# Patient Record
Sex: Female | Born: 1970
Health system: Southern US, Community
[De-identification: ages and names within clinical notes are randomized; demographics above are authoritative.]

## PROBLEM LIST (undated history)

## (undated) DIAGNOSIS — F329 Major depressive disorder, single episode, unspecified: Secondary | ICD-10-CM

## (undated) DIAGNOSIS — M1990 Unspecified osteoarthritis, unspecified site: Secondary | ICD-10-CM

## (undated) DIAGNOSIS — J45909 Unspecified asthma, uncomplicated: Secondary | ICD-10-CM

## (undated) DIAGNOSIS — E785 Hyperlipidemia, unspecified: Secondary | ICD-10-CM

## (undated) DIAGNOSIS — F32A Depression, unspecified: Secondary | ICD-10-CM

## (undated) DIAGNOSIS — M549 Dorsalgia, unspecified: Secondary | ICD-10-CM

## (undated) DIAGNOSIS — M5030 Other cervical disc degeneration, unspecified cervical region: Secondary | ICD-10-CM

## (undated) DIAGNOSIS — G4700 Insomnia, unspecified: Secondary | ICD-10-CM

## (undated) DIAGNOSIS — F988 Other specified behavioral and emotional disorders with onset usually occurring in childhood and adolescence: Secondary | ICD-10-CM

## (undated) DIAGNOSIS — F419 Anxiety disorder, unspecified: Secondary | ICD-10-CM

## (undated) HISTORY — DX: Unspecified asthma, uncomplicated: J45.909

## (undated) HISTORY — DX: Dorsalgia, unspecified: M54.9

## (undated) HISTORY — DX: Hyperlipidemia, unspecified: E78.5

## (undated) HISTORY — DX: Insomnia, unspecified: G47.00

## (undated) HISTORY — DX: Unspecified osteoarthritis, unspecified site: M19.90

## (undated) HISTORY — DX: Other cervical disc degeneration, unspecified cervical region: M50.30

## (undated) HISTORY — DX: Other specified behavioral and emotional disorders with onset usually occurring in childhood and adolescence: F98.8

---

## 2011-03-10 HISTORY — PX: DENTAL SURGERY: SHX609

## 2011-06-08 HISTORY — PX: REPAIR / REINSERT BICEPS TENDON AT ELBOW: SUR1148

## 2015-06-10 DIAGNOSIS — N390 Urinary tract infection, site not specified: Secondary | ICD-10-CM | POA: Diagnosis not present

## 2015-06-20 DIAGNOSIS — F3162 Bipolar disorder, current episode mixed, moderate: Secondary | ICD-10-CM | POA: Diagnosis not present

## 2015-06-21 DIAGNOSIS — E65 Localized adiposity: Secondary | ICD-10-CM

## 2015-06-21 DIAGNOSIS — N62 Hypertrophy of breast: Secondary | ICD-10-CM

## 2015-06-21 HISTORY — DX: Hypertrophy of breast: N62

## 2015-06-21 HISTORY — DX: Localized adiposity: E65

## 2015-07-18 DIAGNOSIS — F3162 Bipolar disorder, current episode mixed, moderate: Secondary | ICD-10-CM | POA: Diagnosis not present

## 2015-07-29 DIAGNOSIS — M542 Cervicalgia: Secondary | ICD-10-CM | POA: Diagnosis not present

## 2015-07-29 DIAGNOSIS — E669 Obesity, unspecified: Secondary | ICD-10-CM | POA: Diagnosis not present

## 2015-07-29 DIAGNOSIS — M549 Dorsalgia, unspecified: Secondary | ICD-10-CM | POA: Diagnosis not present

## 2015-09-06 DIAGNOSIS — M542 Cervicalgia: Secondary | ICD-10-CM | POA: Diagnosis not present

## 2015-09-06 DIAGNOSIS — M546 Pain in thoracic spine: Secondary | ICD-10-CM | POA: Diagnosis not present

## 2015-09-06 DIAGNOSIS — M47812 Spondylosis without myelopathy or radiculopathy, cervical region: Secondary | ICD-10-CM | POA: Diagnosis not present

## 2015-09-16 DIAGNOSIS — N6489 Other specified disorders of breast: Secondary | ICD-10-CM | POA: Diagnosis not present

## 2015-09-16 DIAGNOSIS — E669 Obesity, unspecified: Secondary | ICD-10-CM | POA: Diagnosis not present

## 2015-09-16 DIAGNOSIS — E785 Hyperlipidemia, unspecified: Secondary | ICD-10-CM | POA: Diagnosis not present

## 2015-09-16 DIAGNOSIS — Z79899 Other long term (current) drug therapy: Secondary | ICD-10-CM | POA: Diagnosis not present

## 2015-09-16 DIAGNOSIS — M549 Dorsalgia, unspecified: Secondary | ICD-10-CM | POA: Diagnosis not present

## 2015-10-30 DIAGNOSIS — F3162 Bipolar disorder, current episode mixed, moderate: Secondary | ICD-10-CM | POA: Diagnosis not present

## 2015-12-09 DIAGNOSIS — L03211 Cellulitis of face: Secondary | ICD-10-CM | POA: Diagnosis not present

## 2015-12-09 DIAGNOSIS — R51 Headache: Secondary | ICD-10-CM | POA: Diagnosis not present

## 2015-12-09 DIAGNOSIS — H1045 Other chronic allergic conjunctivitis: Secondary | ICD-10-CM | POA: Diagnosis not present

## 2015-12-16 DIAGNOSIS — M542 Cervicalgia: Secondary | ICD-10-CM | POA: Diagnosis not present

## 2015-12-16 DIAGNOSIS — M549 Dorsalgia, unspecified: Secondary | ICD-10-CM | POA: Diagnosis not present

## 2015-12-16 DIAGNOSIS — E669 Obesity, unspecified: Secondary | ICD-10-CM | POA: Diagnosis not present

## 2015-12-16 DIAGNOSIS — E785 Hyperlipidemia, unspecified: Secondary | ICD-10-CM | POA: Diagnosis not present

## 2016-01-20 DIAGNOSIS — F3162 Bipolar disorder, current episode mixed, moderate: Secondary | ICD-10-CM | POA: Diagnosis not present

## 2016-03-17 DIAGNOSIS — E785 Hyperlipidemia, unspecified: Secondary | ICD-10-CM | POA: Diagnosis not present

## 2016-03-17 DIAGNOSIS — M549 Dorsalgia, unspecified: Secondary | ICD-10-CM | POA: Diagnosis not present

## 2016-03-17 DIAGNOSIS — Z6833 Body mass index (BMI) 33.0-33.9, adult: Secondary | ICD-10-CM | POA: Diagnosis not present

## 2016-03-19 DIAGNOSIS — E785 Hyperlipidemia, unspecified: Secondary | ICD-10-CM | POA: Diagnosis not present

## 2016-03-19 DIAGNOSIS — Z79899 Other long term (current) drug therapy: Secondary | ICD-10-CM | POA: Diagnosis not present

## 2016-04-10 ENCOUNTER — Encounter (HOSPITAL_BASED_OUTPATIENT_CLINIC_OR_DEPARTMENT_OTHER)
Admission: RE | Disposition: A | Discharge status: Home / Self Care | Payer: Self-pay | Source: Ambulatory Visit | Attending: Orthopedic Surgery

## 2016-04-10 ENCOUNTER — Ambulatory Visit (HOSPITAL_BASED_OUTPATIENT_CLINIC_OR_DEPARTMENT_OTHER)
Admission: RE | Admit: 2016-04-10 | Discharge: 2016-04-10 | Disposition: A | Discharge status: Home / Self Care | Payer: BLUE CROSS/BLUE SHIELD | Source: Ambulatory Visit | Attending: Orthopedic Surgery | Admitting: Orthopedic Surgery

## 2016-04-10 ENCOUNTER — Other Ambulatory Visit: Payer: Self-pay | Admitting: Orthopedic Surgery

## 2016-04-10 ENCOUNTER — Encounter (HOSPITAL_BASED_OUTPATIENT_CLINIC_OR_DEPARTMENT_OTHER): Payer: Self-pay

## 2016-04-10 ENCOUNTER — Ambulatory Visit (HOSPITAL_BASED_OUTPATIENT_CLINIC_OR_DEPARTMENT_OTHER): Payer: BLUE CROSS/BLUE SHIELD | Admitting: Anesthesiology

## 2016-04-10 DIAGNOSIS — M6510 Other infective (teno)synovitis, unspecified site: Secondary | ICD-10-CM | POA: Diagnosis not present

## 2016-04-10 DIAGNOSIS — L03019 Cellulitis of unspecified finger: Secondary | ICD-10-CM | POA: Diagnosis not present

## 2016-04-10 DIAGNOSIS — S61259A Open bite of unspecified finger without damage to nail, initial encounter: Secondary | ICD-10-CM | POA: Diagnosis not present

## 2016-04-10 DIAGNOSIS — M65141 Other infective (teno)synovitis, right hand: Secondary | ICD-10-CM | POA: Diagnosis not present

## 2016-04-10 DIAGNOSIS — F419 Anxiety disorder, unspecified: Secondary | ICD-10-CM | POA: Insufficient documentation

## 2016-04-10 DIAGNOSIS — Z79899 Other long term (current) drug therapy: Secondary | ICD-10-CM | POA: Diagnosis not present

## 2016-04-10 DIAGNOSIS — W5501XA Bitten by cat, initial encounter: Secondary | ICD-10-CM | POA: Insufficient documentation

## 2016-04-10 DIAGNOSIS — S61252A Open bite of right middle finger without damage to nail, initial encounter: Secondary | ICD-10-CM | POA: Diagnosis not present

## 2016-04-10 DIAGNOSIS — L089 Local infection of the skin and subcutaneous tissue, unspecified: Secondary | ICD-10-CM

## 2016-04-10 DIAGNOSIS — S61451A Open bite of right hand, initial encounter: Secondary | ICD-10-CM

## 2016-04-10 DIAGNOSIS — A280 Pasteurellosis: Secondary | ICD-10-CM | POA: Diagnosis not present

## 2016-04-10 DIAGNOSIS — L02519 Cutaneous abscess of unspecified hand: Secondary | ICD-10-CM | POA: Diagnosis not present

## 2016-04-10 HISTORY — PX: I & D EXTREMITY: SHX5045

## 2016-04-10 HISTORY — DX: Depression, unspecified: F32.A

## 2016-04-10 HISTORY — DX: Other infective (teno)synovitis, unspecified site: M65.10

## 2016-04-10 HISTORY — DX: Anxiety disorder, unspecified: F41.9

## 2016-04-10 HISTORY — DX: Major depressive disorder, single episode, unspecified: F32.9

## 2016-04-10 HISTORY — DX: Open bite of right hand, initial encounter: S61.451A

## 2016-04-10 HISTORY — DX: Local infection of the skin and subcutaneous tissue, unspecified: L08.9

## 2016-04-10 SURGERY — IRRIGATION AND DEBRIDEMENT EXTREMITY
Anesthesia: General | Site: Hand | Laterality: Right

## 2016-04-10 MED ORDER — FENTANYL CITRATE (PF) 100 MCG/2ML IJ SOLN
INTRAMUSCULAR | Status: AC
  Filled 2016-04-10: qty 2

## 2016-04-10 MED ORDER — LACTATED RINGERS IV SOLN
INTRAVENOUS | Status: DC
  Administered 2016-04-10: 14:00:00 via INTRAVENOUS

## 2016-04-10 MED ORDER — OXYCODONE HCL 5 MG PO TABS: 5.0000 mg | ORAL_TABLET | Freq: Once | ORAL | Status: DC | PRN

## 2016-04-10 MED ORDER — LIDOCAINE 2% (20 MG/ML) 5 ML SYRINGE
INTRAMUSCULAR | Status: DC | PRN
  Administered 2016-04-10: 40 mg via INTRAVENOUS

## 2016-04-10 MED ORDER — ONDANSETRON HCL 4 MG/2ML IJ SOLN
INTRAMUSCULAR | Status: AC
  Filled 2016-04-10: qty 2

## 2016-04-10 MED ORDER — LIDOCAINE 2% (20 MG/ML) 5 ML SYRINGE
INTRAMUSCULAR | Status: AC
  Filled 2016-04-10: qty 5

## 2016-04-10 MED ORDER — VANCOMYCIN HCL IN DEXTROSE 1-5 GM/200ML-% IV SOLN
INTRAVENOUS | Status: AC
  Filled 2016-04-10: qty 200

## 2016-04-10 MED ORDER — MIDAZOLAM HCL 2 MG/2ML IJ SOLN
INTRAMUSCULAR | Status: AC
  Filled 2016-04-10: qty 2

## 2016-04-10 MED ORDER — PROPOFOL 10 MG/ML IV BOLUS
INTRAVENOUS | Status: DC | PRN
  Administered 2016-04-10: 20 mg via INTRAVENOUS
  Administered 2016-04-10: 180 mg via INTRAVENOUS

## 2016-04-10 MED ORDER — HYDROMORPHONE HCL 1 MG/ML IJ SOLN
0.2500 mg | INTRAMUSCULAR | Status: DC | PRN
  Administered 2016-04-10 (×2): 0.5 mg via INTRAVENOUS

## 2016-04-10 MED ORDER — ONDANSETRON HCL 4 MG/2ML IJ SOLN
INTRAMUSCULAR | Status: DC | PRN
Start: 2016-04-10 — End: 2016-04-10
  Administered 2016-04-10: 4 mg via INTRAVENOUS

## 2016-04-10 MED ORDER — MEPERIDINE HCL 25 MG/ML IJ SOLN: 6.2500 mg | INTRAMUSCULAR | Status: DC | PRN

## 2016-04-10 MED ORDER — FENTANYL CITRATE (PF) 100 MCG/2ML IJ SOLN
50.0000 ug | INTRAMUSCULAR | Status: DC | PRN
  Administered 2016-04-10 (×2): 100 ug via INTRAVENOUS

## 2016-04-10 MED ORDER — DEXAMETHASONE SODIUM PHOSPHATE 10 MG/ML IJ SOLN
INTRAMUSCULAR | Status: AC
  Filled 2016-04-10: qty 1

## 2016-04-10 MED ORDER — OXYCODONE HCL 5 MG/5ML PO SOLN: 5.0000 mg | Freq: Once | ORAL | Status: DC | PRN

## 2016-04-10 MED ORDER — KETOROLAC TROMETHAMINE 30 MG/ML IJ SOLN
INTRAMUSCULAR | Status: AC
  Filled 2016-04-10: qty 1

## 2016-04-10 MED ORDER — VANCOMYCIN HCL 1000 MG IV SOLR
INTRAVENOUS | Status: DC | PRN
  Administered 2016-04-10: 1000 mg via INTRAVENOUS

## 2016-04-10 MED ORDER — SCOPOLAMINE 1 MG/3DAYS TD PT72: 1.0000 | MEDICATED_PATCH | Freq: Once | TRANSDERMAL | Status: DC | PRN

## 2016-04-10 MED ORDER — HYDROMORPHONE HCL 1 MG/ML IJ SOLN
INTRAMUSCULAR | Status: AC
  Filled 2016-04-10: qty 1

## 2016-04-10 MED ORDER — PROMETHAZINE HCL 25 MG/ML IJ SOLN: 6.2500 mg | INTRAMUSCULAR | Status: DC | PRN

## 2016-04-10 MED ORDER — MIDAZOLAM HCL 2 MG/2ML IJ SOLN
1.0000 mg | INTRAMUSCULAR | Status: DC | PRN
  Administered 2016-04-10: 2 mg via INTRAVENOUS

## 2016-04-10 MED ORDER — DEXAMETHASONE SODIUM PHOSPHATE 4 MG/ML IJ SOLN
INTRAMUSCULAR | Status: DC | PRN
  Administered 2016-04-10: 10 mg via INTRAVENOUS

## 2016-04-10 MED ORDER — BUPIVACAINE HCL (PF) 0.25 % IJ SOLN
INTRAMUSCULAR | Status: DC | PRN
Start: 2016-04-10 — End: 2016-04-10
  Administered 2016-04-10: 9 mL

## 2016-04-10 SURGICAL SUPPLY — 43 items
BAG DECANTER FOR FLEXI CONT (MISCELLANEOUS) IMPLANT
BLADE MINI RND TIP GREEN BEAV (BLADE) IMPLANT
BLADE SURG 15 STRL LF DISP TIS (BLADE) ×1 IMPLANT
BLADE SURG 15 STRL SS (BLADE) ×1
BNDG COHESIVE 1X5 TAN STRL LF (GAUZE/BANDAGES/DRESSINGS) IMPLANT
BNDG COHESIVE 3X5 TAN STRL LF (GAUZE/BANDAGES/DRESSINGS) IMPLANT
BNDG ESMARK 4X9 LF (GAUZE/BANDAGES/DRESSINGS) IMPLANT
BNDG GAUZE ELAST 4 BULKY (GAUZE/BANDAGES/DRESSINGS) IMPLANT
CHLORAPREP W/TINT 26ML (MISCELLANEOUS) ×2 IMPLANT
CORDS BIPOLAR (ELECTRODE) ×2 IMPLANT
COVER BACK TABLE 60X90IN (DRAPES) ×2 IMPLANT
COVER MAYO STAND STRL (DRAPES) ×2 IMPLANT
CUFF TOURNIQUET SINGLE 18IN (TOURNIQUET CUFF) IMPLANT
DRAPE EXTREMITY T 121X128X90 (DRAPE) ×2 IMPLANT
DRAPE SURG 17X23 STRL (DRAPES) ×2 IMPLANT
GAUZE PACKING IODOFORM 1/4X15 (GAUZE/BANDAGES/DRESSINGS) IMPLANT
GAUZE SPONGE 4X4 12PLY STRL (GAUZE/BANDAGES/DRESSINGS) ×2 IMPLANT
GAUZE XEROFORM 1X8 LF (GAUZE/BANDAGES/DRESSINGS) ×2 IMPLANT
GLOVE BIOGEL PI IND STRL 8.5 (GLOVE) ×1 IMPLANT
GLOVE BIOGEL PI INDICATOR 8.5 (GLOVE) ×1
GLOVE SURG ORTHO 8.0 STRL STRW (GLOVE) ×2 IMPLANT
GOWN STRL REUS W/ TWL LRG LVL3 (GOWN DISPOSABLE) ×1 IMPLANT
GOWN STRL REUS W/TWL LRG LVL3 (GOWN DISPOSABLE) ×1
GOWN STRL REUS W/TWL XL LVL3 (GOWN DISPOSABLE) ×2 IMPLANT
LOOP VESSEL MAXI BLUE (MISCELLANEOUS) IMPLANT
NEEDLE PRECISIONGLIDE 27X1.5 (NEEDLE) IMPLANT
NS IRRIG 1000ML POUR BTL (IV SOLUTION) ×2 IMPLANT
PACK BASIN DAY SURGERY FS (CUSTOM PROCEDURE TRAY) ×2 IMPLANT
PAD CAST 3X4 CTTN HI CHSV (CAST SUPPLIES) IMPLANT
PADDING CAST ABS 4INX4YD NS (CAST SUPPLIES) ×1
PADDING CAST ABS COTTON 4X4 ST (CAST SUPPLIES) ×1 IMPLANT
PADDING CAST COTTON 3X4 STRL (CAST SUPPLIES)
SPLINT PLASTER CAST XFAST 3X15 (CAST SUPPLIES) IMPLANT
SPLINT PLASTER XTRA FASTSET 3X (CAST SUPPLIES)
STOCKINETTE 4X48 STRL (DRAPES) ×2 IMPLANT
SUT ETHILON 4 0 PS 2 18 (SUTURE) ×2 IMPLANT
SWAB COLLECTION DEVICE MRSA (MISCELLANEOUS) IMPLANT
SWAB CULTURE ESWAB REG 1ML (MISCELLANEOUS) IMPLANT
SYR BULB 3OZ (MISCELLANEOUS) ×2 IMPLANT
SYR CONTROL 10ML LL (SYRINGE) IMPLANT
TOWEL OR 17X24 6PK STRL BLUE (TOWEL DISPOSABLE) ×4 IMPLANT
TUBE FEEDING ENTERAL 5FR 16IN (TUBING) IMPLANT
UNDERPAD 30X30 (UNDERPADS AND DIAPERS) ×2 IMPLANT

## 2016-04-10 NOTE — Transfer of Care (Signed)
Immediate Anesthesia Transfer of Care Note  Patient: Chloe Harris  Procedure(s) Performed: Procedure(s) with comments: IRRIGATION AND DEBRIDEMENT EXTREMITY (Right) - I&D FLEXOR SHEATH RIGHT MIDDLE FINGER  Patient Location: PACU  Anesthesia Type:General  Level of Consciousness: awake and sedated  Airway & Oxygen Therapy: Patient Spontanous Breathing and Patient connected to face mask oxygen  Post-op Assessment: Report given to RN and Post -op Vital signs reviewed and stable  Post vital signs: Reviewed and stable  Last Vitals:  Vitals:   04/10/16 1347  BP: (!) 104/50  Pulse: 80  Resp: 18  Temp: 37.3 C    Last Pain:  Vitals:   04/10/16 1347  TempSrc: Oral  PainSc: 6       Patients Stated Pain Goal: 2 ([id number])  Complications: No apparent anesthesia complications

## 2016-04-10 NOTE — Op Note (Signed)
Dictation Number [id number]

## 2016-04-10 NOTE — Brief Op Note (Signed)
04/10/2016  3:42 PM  PATIENT:  Chloe Harris  46 y.o. female  PRE-OPERATIVE DIAGNOSIS:  cat bite  POST-OPERATIVE DIAGNOSIS:  cat bite  PROCEDURE:  Procedure(s) with comments: IRRIGATION AND DEBRIDEMENT EXTREMITY (Right) - I&D FLEXOR SHEATH RIGHT MIDDLE FINGER  SURGEON:  Surgeon(s) and Role:    * Daryll Brod, MD - Primary  PHYSICIAN ASSISTANT:   ASSISTANTS: none   ANESTHESIA:   local and general  EBL:  Total I/O In: 600 [I.V.:600] Out: 2 [Blood:2]  BLOOD ADMINISTERED:none  DRAINS: Penrose drain in the finnger   LOCAL MEDICATIONS USED:  BUPIVICAINE   SPECIMEN:  Source of Specimen:  cultures  DISPOSITION OF SPECIMEN:  PATHOLOGY  COUNTS:  YES  TOURNIQUET:   Total Tourniquet Time Documented: Upper Arm (Right) - 23 minutes Total: Upper Arm (Right) - 23 minutes   DICTATION: .Other Dictation: Dictation Number [id number]  PLAN OF CARE: Discharge to home after PACU  PATIENT DISPOSITION:  PACU - hemodynamically stable.

## 2016-04-10 NOTE — Anesthesia Procedure Notes (Signed)
Procedure Name: LMA Insertion Performed by: Arianny Pun W Pre-anesthesia Checklist: Patient identified, Emergency Drugs available, Suction available and Patient being monitored Patient Re-evaluated:Patient Re-evaluated prior to inductionOxygen Delivery Method: Circle system utilized Preoxygenation: Pre-oxygenation with 100% oxygen Intubation Type: IV induction Ventilation: Mask ventilation without difficulty LMA: LMA inserted LMA Size: 4.0 Number of attempts: 1 Placement Confirmation: positive ETCO2 Tube secured with: Tape Dental Injury: Teeth and Oropharynx as per pre-operative assessment        

## 2016-04-10 NOTE — Anesthesia Preprocedure Evaluation (Addendum)
Anesthesia Evaluation  Patient identified by MRN, date of birth, ID band Patient awake    Reviewed: Allergy & Precautions, NPO status , Patient's Chart, lab work & pertinent test results  Airway Mallampati: II  TM Distance: >3 FB Neck ROM: Full    Dental no notable dental hx.    Pulmonary neg pulmonary ROS,    Pulmonary exam normal breath sounds clear to auscultation       Cardiovascular negative cardio ROS Normal cardiovascular exam Rhythm:Regular Rate:Normal     Neuro/Psych negative neurological ROS  negative psych ROS   GI/Hepatic negative GI ROS, Neg liver ROS,   Endo/Other  negative endocrine ROS  Renal/GU negative Renal ROS     Musculoskeletal negative musculoskeletal ROS (+)   Abdominal   Peds  Hematology negative hematology ROS (+)   Anesthesia Other Findings   Reproductive/Obstetrics negative OB ROS                             Anesthesia Physical Anesthesia Plan  ASA: II  Anesthesia Plan: General   Post-op Pain Management:    Induction: Intravenous  Airway Management Planned: LMA  Additional Equipment:   Intra-op Plan:   Post-operative Plan: Extubation in OR  Informed Consent: I have reviewed the patients History and Physical, chart, labs and discussed the procedure including the risks, benefits and alternatives for the proposed anesthesia with the patient or authorized representative who has indicated his/her understanding and acceptance.   Dental advisory given  Plan Discussed with: CRNA  Anesthesia Plan Comments:        Anesthesia Quick Evaluation

## 2016-04-10 NOTE — Discharge Instructions (Addendum)

## 2016-04-10 NOTE — H&P (Signed)
.   Chloe Harris is an 46 y.o. female.   Chief Complaint: cat bite right middle finger HPI: Chloe Harris is a 32 right handed female who was bit by her cat yesterday. She has has increasing pain and swelling of the middle finger. She has been on doxycycline.    Past Medical History:  Diagnosis Date  . Anxiety   . Depression     Past Surgical History:  Procedure Laterality Date  . CESAREAN SECTION  2004  . DENTAL SURGERY  2013   Bone graft  . REPAIR / REINSERT BICEPS TENDON AT ELBOW Right 06/2011    History reviewed. No pertinent family history. Social History:  reports that she has never smoked. She has never used smokeless tobacco. She reports that she drinks alcohol. She reports that she uses drugs about 2 times per week.  Allergies:  Allergies  Allergen Reactions  . Biaxin [Clarithromycin] Swelling  . Penicillins Hives  . Ciprofloxacin Nausea And Vomiting  . Sulfa Antibiotics Hives and Nausea Only    Medications Prior to Admission  Medication Sig Dispense Refill  . atorvastatin (LIPITOR) 20 MG tablet Take 20 mg by mouth daily.    . clonazePAM (KLONOPIN) 1 MG tablet Take 1 mg by mouth 2 (two) times daily as needed for anxiety (as needed).    . eszopiclone (LUNESTA) 2 MG TABS tablet Take 3 mg by mouth at bedtime as needed for sleep. Take immediately before bedtime    . lamoTRIgine (LAMICTAL) 200 MG tablet Take 200 mg by mouth daily.    . Lorcaserin HCl (BELVIQ) 10 MG TABS Take 10 mg by mouth 1 day or 1 dose.      No results found for this or any previous visit (from the past 48 hour(s)).  No results found.   Pertinent items are noted in HPI.  Blood pressure (!) 104/50, pulse 80, temperature 99.2 F (37.3 C), temperature source Oral, resp. rate 18, height 4\' 11"  (1.499 m), weight 75.8 kg (167 lb), last menstrual period 03/25/2016, SpO2 98 %.  General appearance: alert, cooperative and appears stated age Head: Normocephalic, without obvious abnormality Neck: no  JVD Resp: clear to auscultation bilaterally Cardio: regular rate and rhythm, S1, S2 normal, no murmur, click, rub or gallop GI: soft, non-tender; bowel sounds normal; no masses,  no organomegaly Extremities: Kanavels signs right middle finger Pulses: 2+ and symmetric Skin: Skin color, texture, turgor normal. No rashes or lesions Neurologic: Grossly normal Incision/Wound: open  Assessment/Plan Flexor sheath infection right middle finger Plan: I&D right middle finger  Leshaun Biebel R 04/10/2016, 2:44 PM

## 2016-04-10 NOTE — Anesthesia Postprocedure Evaluation (Signed)
Anesthesia Post Note  Patient: Chloe Harris  Procedure(s) Performed: Procedure(s) (LRB): IRRIGATION AND DEBRIDEMENT EXTREMITY (Right)  Patient location during evaluation: PACU Anesthesia Type: General Level of consciousness: sedated and patient cooperative Pain management: pain level controlled Vital Signs Assessment: post-procedure vital signs reviewed and stable Respiratory status: spontaneous breathing Cardiovascular status: stable Anesthetic complications: no       Last Vitals:  Vitals:   04/10/16 1630 04/10/16 1637  BP: 120/72   Pulse: 79 82  Resp: 12 15  Temp:      Last Pain:  Vitals:   04/10/16 1615  TempSrc:   PainSc: [place]

## 2016-04-11 NOTE — Op Note (Signed)
NAMEJETAIME, Chloe Harris NO.:  [id number]  MEDICAL RECORD NO.:  [id number]  LOCATION:                                 FACILITY:  PHYSICIAN:  Daryll Brod, M.D.            DATE OF BIRTH:  DATE OF PROCEDURE:  04/10/2016 DATE OF DISCHARGE:                              OPERATIVE REPORT   PREOPERATIVE DIAGNOSIS:  Flexor sheath infection, right middle finger.  POSTOPERATIVE DIAGNOSIS:  Flexor sheath infection, right middle finger.  OPERATION:  Incision and drainage of cat bite, with incision and drainage of flexor sheath with irrigation, right middle finger.  SURGEON:  Daryll Brod, MD.  ANESTHESIA:  General.  PLACE OF SURGERY:  [facility].  HISTORY:  The patient is a 46 year old female, who sustained a cat bite to her right middle finger yesterday on April 09, 2016.  She received doxycycline but this had significantly swollen, become progressively more painful, red with extending pain proximally and distally into the palm and the digit.  She is seen and admitted for incision and drainage with cannibal signs being present to the right middle finger.  She is admitted for incision and drainage.  She is aware that there is potential for significant problems with stiffness; injury to arteries, nerves, tendons; loss of tendon function to the middle finger requiring further intervention.  In the preoperative area, the patient was seen, the extremity marked by both the patient and surgeon.  PROCEDURE IN DETAIL:  The patient was brought to the operating room, where a general anesthetic was carried out without difficulty.  She was prepped using Betadine scrub and solution in a supine position with the right arm free.  A 3-minute dry time was allowed.  Time-out taken confirming the patient and procedure.  The areas of the 2 bite wounds on the volar aspect of the middle and proximal phalanx were then each opened making an incision.  Purulent material was  immediately encountered, and cultures taken for both aerobic and anaerobic cultures. A separate incision was then made over the area just proximal to the distal interphalangeal joint crease.  This was carried down through subcutaneous tissues centrally located to the flexor tendon.  A separate incision was then made at the flexion crease of the middle finger in the palm, this was oblique in nature.  This was carried down through subcutaneous tissue to the A1 pulley.  Retractors were placed protecting the neurovascular bundles radially and ulnarly.  A small incision was made in the central aspect of the A1 pulley.  A partial tenosynovectomy performed proximally.  An irrigation catheter, infant feeding tube was then attempted to be inserted in a distal proximal direction without significant success.  This was then reversed and placed in the A1 pulley and threaded off along the flexor sheath.  This was then irrigated with saline irrigating it with approximately 350-400 mL of saline.  Good flow was immediately encountered distally.  A 22-gauge intracath needle, had the needle removed and the sheath was then passed into the flexor tendon sheath in the distal and irrigation from a distal to proximal direction was then performed.  A total of  approximately 500 mL of saline was used. This was entirely clear.  Significant fluid was present in the flexor sheath when it was initially opened proximally.  The wounds were then packed with iodoform gauze.  The infant feeding tube was left into the sheath proximally to maintain adequate drainage.  A local metacarpal block with 0.25% bupivacaine without epinephrine was given, approximately 9 mL was used.  A sterile compressive dressing, dorsal splint was applied after application of iodoform gauze in each of the wounds to maintain them open proximally, distally on either side of the original bite.  On deflation of the tourniquet, all fingers  immediately pinked.  Tourniquet was inflated at the beginning of the procedure with exsanguination of the limb from the wrist proximally.  All fingers immediately pinked.  She was taken to the recovery room for observation in satisfactory condition.  She will be discharged to home to return to the [facility] on Monday.  She is discharged on Norco, Flagyl, and doxycycline.          ______________________________ Daryll Brod, M.D.     GK/MEDQ  D:  04/10/2016  T:  04/11/2016  Job:  [id number]

## 2016-04-13 ENCOUNTER — Encounter (HOSPITAL_BASED_OUTPATIENT_CLINIC_OR_DEPARTMENT_OTHER): Payer: Self-pay | Admitting: Orthopedic Surgery

## 2016-04-13 DIAGNOSIS — M79644 Pain in right finger(s): Secondary | ICD-10-CM

## 2016-04-13 DIAGNOSIS — S61451A Open bite of right hand, initial encounter: Secondary | ICD-10-CM | POA: Diagnosis not present

## 2016-04-13 DIAGNOSIS — S61451D Open bite of right hand, subsequent encounter: Secondary | ICD-10-CM | POA: Diagnosis not present

## 2016-04-13 DIAGNOSIS — S61259D Open bite of unspecified finger without damage to nail, subsequent encounter: Secondary | ICD-10-CM | POA: Diagnosis not present

## 2016-04-13 HISTORY — DX: Pain in right finger(s): M79.644

## 2016-04-14 ENCOUNTER — Encounter (HOSPITAL_BASED_OUTPATIENT_CLINIC_OR_DEPARTMENT_OTHER): Payer: Self-pay | Admitting: Orthopedic Surgery

## 2016-04-14 NOTE — Addendum Note (Signed)
Addendum  created 04/14/16 0741 by Nolon Nations, MD   Sign clinical note, SmartForm saved

## 2016-04-15 DIAGNOSIS — S61259A Open bite of unspecified finger without damage to nail, initial encounter: Secondary | ICD-10-CM | POA: Diagnosis not present

## 2016-04-15 DIAGNOSIS — M79644 Pain in right finger(s): Secondary | ICD-10-CM | POA: Diagnosis not present

## 2016-04-15 DIAGNOSIS — S61451A Open bite of right hand, initial encounter: Secondary | ICD-10-CM | POA: Diagnosis not present

## 2016-04-15 DIAGNOSIS — S61451D Open bite of right hand, subsequent encounter: Secondary | ICD-10-CM | POA: Diagnosis not present

## 2016-04-15 LAB — AEROBIC/ANAEROBIC CULTURE (SURGICAL/DEEP WOUND)

## 2016-04-15 LAB — AEROBIC/ANAEROBIC CULTURE W GRAM STAIN (SURGICAL/DEEP WOUND)

## 2016-04-17 DIAGNOSIS — S61451A Open bite of right hand, initial encounter: Secondary | ICD-10-CM | POA: Diagnosis not present

## 2016-04-17 DIAGNOSIS — S61259D Open bite of unspecified finger without damage to nail, subsequent encounter: Secondary | ICD-10-CM | POA: Diagnosis not present

## 2016-04-17 DIAGNOSIS — S61451D Open bite of right hand, subsequent encounter: Secondary | ICD-10-CM | POA: Diagnosis not present

## 2016-04-17 DIAGNOSIS — M79644 Pain in right finger(s): Secondary | ICD-10-CM | POA: Diagnosis not present

## 2016-04-20 DIAGNOSIS — M6510 Other infective (teno)synovitis, unspecified site: Secondary | ICD-10-CM | POA: Diagnosis not present

## 2016-04-20 DIAGNOSIS — S61451A Open bite of right hand, initial encounter: Secondary | ICD-10-CM | POA: Diagnosis not present

## 2016-04-20 DIAGNOSIS — M25641 Stiffness of right hand, not elsewhere classified: Secondary | ICD-10-CM

## 2016-04-20 DIAGNOSIS — S61451D Open bite of right hand, subsequent encounter: Secondary | ICD-10-CM | POA: Diagnosis not present

## 2016-04-20 DIAGNOSIS — M79644 Pain in right finger(s): Secondary | ICD-10-CM | POA: Diagnosis not present

## 2016-04-20 HISTORY — DX: Stiffness of right hand, not elsewhere classified: M25.641

## 2016-05-11 DIAGNOSIS — H1013 Acute atopic conjunctivitis, bilateral: Secondary | ICD-10-CM | POA: Diagnosis not present

## 2016-05-25 DIAGNOSIS — F3162 Bipolar disorder, current episode mixed, moderate: Secondary | ICD-10-CM | POA: Diagnosis not present

## 2016-08-06 DIAGNOSIS — R41840 Attention and concentration deficit: Secondary | ICD-10-CM | POA: Diagnosis not present

## 2016-08-06 DIAGNOSIS — F902 Attention-deficit hyperactivity disorder, combined type: Secondary | ICD-10-CM | POA: Diagnosis not present

## 2016-08-06 DIAGNOSIS — F419 Anxiety disorder, unspecified: Secondary | ICD-10-CM | POA: Diagnosis not present

## 2016-08-06 DIAGNOSIS — Z79899 Other long term (current) drug therapy: Secondary | ICD-10-CM | POA: Diagnosis not present

## 2016-08-06 DIAGNOSIS — F1920 Other psychoactive substance dependence, uncomplicated: Secondary | ICD-10-CM | POA: Diagnosis not present

## 2016-09-08 DIAGNOSIS — Z79899 Other long term (current) drug therapy: Secondary | ICD-10-CM | POA: Diagnosis not present

## 2016-09-08 DIAGNOSIS — N912 Amenorrhea, unspecified: Secondary | ICD-10-CM | POA: Diagnosis not present

## 2016-09-08 DIAGNOSIS — Z6832 Body mass index (BMI) 32.0-32.9, adult: Secondary | ICD-10-CM | POA: Diagnosis not present

## 2016-09-08 DIAGNOSIS — E785 Hyperlipidemia, unspecified: Secondary | ICD-10-CM | POA: Diagnosis not present

## 2016-09-08 DIAGNOSIS — M549 Dorsalgia, unspecified: Secondary | ICD-10-CM | POA: Diagnosis not present

## 2016-10-14 DIAGNOSIS — F902 Attention-deficit hyperactivity disorder, combined type: Secondary | ICD-10-CM | POA: Diagnosis not present

## 2016-10-14 DIAGNOSIS — F1920 Other psychoactive substance dependence, uncomplicated: Secondary | ICD-10-CM | POA: Diagnosis not present

## 2016-10-14 DIAGNOSIS — F419 Anxiety disorder, unspecified: Secondary | ICD-10-CM | POA: Diagnosis not present

## 2016-10-14 DIAGNOSIS — Z79899 Other long term (current) drug therapy: Secondary | ICD-10-CM | POA: Diagnosis not present

## 2016-10-14 DIAGNOSIS — F338 Other recurrent depressive disorders: Secondary | ICD-10-CM | POA: Diagnosis not present

## 2016-10-21 DIAGNOSIS — R350 Frequency of micturition: Secondary | ICD-10-CM | POA: Diagnosis not present

## 2016-12-08 DIAGNOSIS — F3162 Bipolar disorder, current episode mixed, moderate: Secondary | ICD-10-CM | POA: Diagnosis not present

## 2016-12-09 DIAGNOSIS — F902 Attention-deficit hyperactivity disorder, combined type: Secondary | ICD-10-CM | POA: Diagnosis not present

## 2016-12-09 DIAGNOSIS — F338 Other recurrent depressive disorders: Secondary | ICD-10-CM | POA: Diagnosis not present

## 2016-12-09 DIAGNOSIS — F419 Anxiety disorder, unspecified: Secondary | ICD-10-CM | POA: Diagnosis not present

## 2016-12-09 DIAGNOSIS — Z79899 Other long term (current) drug therapy: Secondary | ICD-10-CM | POA: Diagnosis not present

## 2016-12-10 DIAGNOSIS — J45909 Unspecified asthma, uncomplicated: Secondary | ICD-10-CM | POA: Diagnosis not present

## 2016-12-10 DIAGNOSIS — E785 Hyperlipidemia, unspecified: Secondary | ICD-10-CM | POA: Diagnosis not present

## 2016-12-10 DIAGNOSIS — M549 Dorsalgia, unspecified: Secondary | ICD-10-CM | POA: Diagnosis not present

## 2016-12-10 DIAGNOSIS — Z6832 Body mass index (BMI) 32.0-32.9, adult: Secondary | ICD-10-CM | POA: Diagnosis not present

## 2016-12-25 DIAGNOSIS — L300 Nummular dermatitis: Secondary | ICD-10-CM | POA: Diagnosis not present

## 2016-12-25 DIAGNOSIS — B3783 Candidal cheilitis: Secondary | ICD-10-CM | POA: Diagnosis not present

## 2017-01-01 DIAGNOSIS — F338 Other recurrent depressive disorders: Secondary | ICD-10-CM | POA: Diagnosis not present

## 2017-01-01 DIAGNOSIS — F902 Attention-deficit hyperactivity disorder, combined type: Secondary | ICD-10-CM | POA: Diagnosis not present

## 2017-01-01 DIAGNOSIS — F419 Anxiety disorder, unspecified: Secondary | ICD-10-CM | POA: Diagnosis not present

## 2017-01-01 DIAGNOSIS — Z79899 Other long term (current) drug therapy: Secondary | ICD-10-CM | POA: Diagnosis not present

## 2017-01-14 DIAGNOSIS — F902 Attention-deficit hyperactivity disorder, combined type: Secondary | ICD-10-CM | POA: Diagnosis not present

## 2017-01-14 DIAGNOSIS — F3174 Bipolar disorder, in full remission, most recent episode manic: Secondary | ICD-10-CM | POA: Diagnosis not present

## 2017-01-14 DIAGNOSIS — Z79899 Other long term (current) drug therapy: Secondary | ICD-10-CM | POA: Diagnosis not present

## 2017-02-08 DIAGNOSIS — Z79899 Other long term (current) drug therapy: Secondary | ICD-10-CM | POA: Diagnosis not present

## 2017-02-08 DIAGNOSIS — F902 Attention-deficit hyperactivity disorder, combined type: Secondary | ICD-10-CM | POA: Diagnosis not present

## 2017-02-08 DIAGNOSIS — F419 Anxiety disorder, unspecified: Secondary | ICD-10-CM | POA: Diagnosis not present

## 2017-02-08 DIAGNOSIS — F3174 Bipolar disorder, in full remission, most recent episode manic: Secondary | ICD-10-CM | POA: Diagnosis not present

## 2017-02-09 DIAGNOSIS — F3162 Bipolar disorder, current episode mixed, moderate: Secondary | ICD-10-CM | POA: Diagnosis not present

## 2017-03-30 DIAGNOSIS — H9201 Otalgia, right ear: Secondary | ICD-10-CM | POA: Diagnosis not present

## 2017-03-30 DIAGNOSIS — F1990 Other psychoactive substance use, unspecified, uncomplicated: Secondary | ICD-10-CM | POA: Diagnosis not present

## 2017-05-11 DIAGNOSIS — Z79899 Other long term (current) drug therapy: Secondary | ICD-10-CM | POA: Diagnosis not present

## 2017-05-11 DIAGNOSIS — F419 Anxiety disorder, unspecified: Secondary | ICD-10-CM | POA: Diagnosis not present

## 2017-05-11 DIAGNOSIS — F3174 Bipolar disorder, in full remission, most recent episode manic: Secondary | ICD-10-CM | POA: Diagnosis not present

## 2017-05-11 DIAGNOSIS — F338 Other recurrent depressive disorders: Secondary | ICD-10-CM | POA: Diagnosis not present

## 2017-05-11 DIAGNOSIS — F902 Attention-deficit hyperactivity disorder, combined type: Secondary | ICD-10-CM | POA: Diagnosis not present

## 2017-05-12 DIAGNOSIS — F901 Attention-deficit hyperactivity disorder, predominantly hyperactive type: Secondary | ICD-10-CM | POA: Diagnosis not present

## 2017-05-12 DIAGNOSIS — R05 Cough: Secondary | ICD-10-CM | POA: Diagnosis not present

## 2017-05-12 DIAGNOSIS — E785 Hyperlipidemia, unspecified: Secondary | ICD-10-CM | POA: Diagnosis not present

## 2017-05-12 DIAGNOSIS — F3162 Bipolar disorder, current episode mixed, moderate: Secondary | ICD-10-CM | POA: Diagnosis not present

## 2017-05-12 DIAGNOSIS — J45909 Unspecified asthma, uncomplicated: Secondary | ICD-10-CM | POA: Diagnosis not present

## 2017-05-12 DIAGNOSIS — M549 Dorsalgia, unspecified: Secondary | ICD-10-CM | POA: Diagnosis not present

## 2017-07-14 DIAGNOSIS — F3174 Bipolar disorder, in full remission, most recent episode manic: Secondary | ICD-10-CM | POA: Diagnosis not present

## 2017-07-14 DIAGNOSIS — F900 Attention-deficit hyperactivity disorder, predominantly inattentive type: Secondary | ICD-10-CM | POA: Diagnosis not present

## 2017-07-30 DIAGNOSIS — F419 Anxiety disorder, unspecified: Secondary | ICD-10-CM | POA: Diagnosis not present

## 2017-07-30 DIAGNOSIS — Z79899 Other long term (current) drug therapy: Secondary | ICD-10-CM | POA: Diagnosis not present

## 2017-07-30 DIAGNOSIS — F338 Other recurrent depressive disorders: Secondary | ICD-10-CM | POA: Diagnosis not present

## 2017-07-30 DIAGNOSIS — F902 Attention-deficit hyperactivity disorder, combined type: Secondary | ICD-10-CM | POA: Diagnosis not present

## 2017-08-03 DIAGNOSIS — E785 Hyperlipidemia, unspecified: Secondary | ICD-10-CM | POA: Diagnosis not present

## 2017-08-03 DIAGNOSIS — Z79891 Long term (current) use of opiate analgesic: Secondary | ICD-10-CM | POA: Diagnosis not present

## 2017-08-03 DIAGNOSIS — M549 Dorsalgia, unspecified: Secondary | ICD-10-CM | POA: Diagnosis not present

## 2017-08-03 DIAGNOSIS — Z79899 Other long term (current) drug therapy: Secondary | ICD-10-CM | POA: Diagnosis not present

## 2017-08-03 DIAGNOSIS — N951 Menopausal and female climacteric states: Secondary | ICD-10-CM | POA: Diagnosis not present

## 2017-10-07 DIAGNOSIS — M549 Dorsalgia, unspecified: Secondary | ICD-10-CM | POA: Diagnosis not present

## 2017-10-29 DIAGNOSIS — Z79899 Other long term (current) drug therapy: Secondary | ICD-10-CM | POA: Diagnosis not present

## 2017-10-29 DIAGNOSIS — F419 Anxiety disorder, unspecified: Secondary | ICD-10-CM | POA: Diagnosis not present

## 2017-10-29 DIAGNOSIS — F902 Attention-deficit hyperactivity disorder, combined type: Secondary | ICD-10-CM | POA: Diagnosis not present

## 2017-12-30 DIAGNOSIS — F900 Attention-deficit hyperactivity disorder, predominantly inattentive type: Secondary | ICD-10-CM | POA: Diagnosis not present

## 2017-12-30 DIAGNOSIS — F3174 Bipolar disorder, in full remission, most recent episode manic: Secondary | ICD-10-CM | POA: Diagnosis not present

## 2018-01-21 DIAGNOSIS — M549 Dorsalgia, unspecified: Secondary | ICD-10-CM | POA: Diagnosis not present

## 2018-01-21 DIAGNOSIS — J45909 Unspecified asthma, uncomplicated: Secondary | ICD-10-CM | POA: Diagnosis not present

## 2018-01-21 DIAGNOSIS — R05 Cough: Secondary | ICD-10-CM | POA: Diagnosis not present

## 2018-01-21 DIAGNOSIS — E785 Hyperlipidemia, unspecified: Secondary | ICD-10-CM | POA: Diagnosis not present

## 2018-01-27 DIAGNOSIS — F419 Anxiety disorder, unspecified: Secondary | ICD-10-CM | POA: Diagnosis not present

## 2018-01-27 DIAGNOSIS — F902 Attention-deficit hyperactivity disorder, combined type: Secondary | ICD-10-CM | POA: Diagnosis not present

## 2018-01-27 DIAGNOSIS — F3174 Bipolar disorder, in full remission, most recent episode manic: Secondary | ICD-10-CM | POA: Diagnosis not present

## 2018-01-27 DIAGNOSIS — Z79899 Other long term (current) drug therapy: Secondary | ICD-10-CM | POA: Diagnosis not present

## 2018-03-23 DIAGNOSIS — F3174 Bipolar disorder, in full remission, most recent episode manic: Secondary | ICD-10-CM | POA: Diagnosis not present

## 2018-03-23 DIAGNOSIS — F900 Attention-deficit hyperactivity disorder, predominantly inattentive type: Secondary | ICD-10-CM | POA: Diagnosis not present

## 2018-05-03 DIAGNOSIS — Z79899 Other long term (current) drug therapy: Secondary | ICD-10-CM | POA: Diagnosis not present

## 2018-05-03 DIAGNOSIS — F419 Anxiety disorder, unspecified: Secondary | ICD-10-CM | POA: Diagnosis not present

## 2018-05-03 DIAGNOSIS — F902 Attention-deficit hyperactivity disorder, combined type: Secondary | ICD-10-CM | POA: Diagnosis not present

## 2018-06-13 DIAGNOSIS — M549 Dorsalgia, unspecified: Secondary | ICD-10-CM | POA: Diagnosis not present

## 2018-06-13 DIAGNOSIS — W57XXXA Bitten or stung by nonvenomous insect and other nonvenomous arthropods, initial encounter: Secondary | ICD-10-CM | POA: Diagnosis not present

## 2018-06-13 DIAGNOSIS — L03312 Cellulitis of back [any part except buttock]: Secondary | ICD-10-CM | POA: Diagnosis not present

## 2018-06-13 DIAGNOSIS — J45909 Unspecified asthma, uncomplicated: Secondary | ICD-10-CM | POA: Diagnosis not present

## 2018-06-22 DIAGNOSIS — F900 Attention-deficit hyperactivity disorder, predominantly inattentive type: Secondary | ICD-10-CM | POA: Diagnosis not present

## 2018-06-22 DIAGNOSIS — F3174 Bipolar disorder, in full remission, most recent episode manic: Secondary | ICD-10-CM | POA: Diagnosis not present

## 2018-09-16 DIAGNOSIS — F902 Attention-deficit hyperactivity disorder, combined type: Secondary | ICD-10-CM | POA: Diagnosis not present

## 2018-09-16 DIAGNOSIS — F419 Anxiety disorder, unspecified: Secondary | ICD-10-CM | POA: Diagnosis not present

## 2018-09-16 DIAGNOSIS — F3174 Bipolar disorder, in full remission, most recent episode manic: Secondary | ICD-10-CM | POA: Diagnosis not present

## 2018-09-27 DIAGNOSIS — F3174 Bipolar disorder, in full remission, most recent episode manic: Secondary | ICD-10-CM | POA: Diagnosis not present

## 2018-09-27 DIAGNOSIS — F900 Attention-deficit hyperactivity disorder, predominantly inattentive type: Secondary | ICD-10-CM | POA: Diagnosis not present

## 2018-10-06 DIAGNOSIS — N951 Menopausal and female climacteric states: Secondary | ICD-10-CM | POA: Diagnosis not present

## 2018-10-06 DIAGNOSIS — M549 Dorsalgia, unspecified: Secondary | ICD-10-CM | POA: Diagnosis not present

## 2018-10-06 DIAGNOSIS — Z79899 Other long term (current) drug therapy: Secondary | ICD-10-CM | POA: Diagnosis not present

## 2018-10-06 DIAGNOSIS — L03211 Cellulitis of face: Secondary | ICD-10-CM | POA: Diagnosis not present

## 2018-10-24 DIAGNOSIS — L0100 Impetigo, unspecified: Secondary | ICD-10-CM | POA: Diagnosis not present

## 2018-12-27 DIAGNOSIS — F900 Attention-deficit hyperactivity disorder, predominantly inattentive type: Secondary | ICD-10-CM | POA: Diagnosis not present

## 2019-01-03 DIAGNOSIS — F419 Anxiety disorder, unspecified: Secondary | ICD-10-CM | POA: Diagnosis not present

## 2019-01-03 DIAGNOSIS — F902 Attention-deficit hyperactivity disorder, combined type: Secondary | ICD-10-CM | POA: Diagnosis not present

## 2019-01-03 DIAGNOSIS — F3174 Bipolar disorder, in full remission, most recent episode manic: Secondary | ICD-10-CM | POA: Diagnosis not present

## 2019-01-03 DIAGNOSIS — Z79899 Other long term (current) drug therapy: Secondary | ICD-10-CM | POA: Diagnosis not present

## 2019-03-20 DIAGNOSIS — F902 Attention-deficit hyperactivity disorder, combined type: Secondary | ICD-10-CM | POA: Diagnosis not present

## 2019-03-20 DIAGNOSIS — F3174 Bipolar disorder, in full remission, most recent episode manic: Secondary | ICD-10-CM | POA: Diagnosis not present

## 2019-03-20 DIAGNOSIS — Z79899 Other long term (current) drug therapy: Secondary | ICD-10-CM | POA: Diagnosis not present

## 2019-03-20 DIAGNOSIS — F419 Anxiety disorder, unspecified: Secondary | ICD-10-CM | POA: Diagnosis not present

## 2019-05-16 ENCOUNTER — Other Ambulatory Visit: Payer: Self-pay

## 2019-05-16 ENCOUNTER — Ambulatory Visit (INDEPENDENT_AMBULATORY_CARE_PROVIDER_SITE_OTHER): Payer: BC Managed Care – PPO | Admitting: Nurse Practitioner

## 2019-05-16 VITALS — BP 106/68 | HR 94 | Temp 97.8°F | Resp 15 | Ht 59.0 in | Wt 174.1 lb

## 2019-05-16 DIAGNOSIS — R0609 Other forms of dyspnea: Secondary | ICD-10-CM | POA: Insufficient documentation

## 2019-05-16 DIAGNOSIS — Z8616 Personal history of COVID-19: Secondary | ICD-10-CM

## 2019-05-16 DIAGNOSIS — R0602 Shortness of breath: Secondary | ICD-10-CM

## 2019-05-16 DIAGNOSIS — R059 Cough, unspecified: Secondary | ICD-10-CM

## 2019-05-16 DIAGNOSIS — R05 Cough: Secondary | ICD-10-CM | POA: Diagnosis not present

## 2019-05-16 DIAGNOSIS — R5381 Other malaise: Secondary | ICD-10-CM | POA: Diagnosis not present

## 2019-05-16 DIAGNOSIS — R0600 Dyspnea, unspecified: Secondary | ICD-10-CM | POA: Insufficient documentation

## 2019-05-16 HISTORY — DX: Personal history of COVID-19: Z86.16

## 2019-05-16 HISTORY — DX: Shortness of breath: R06.02

## 2019-05-16 HISTORY — DX: Cough, unspecified: R05.9

## 2019-05-16 MED ORDER — BENZONATATE 200 MG PO CAPS: 200.0000 mg | ORAL_CAPSULE | Freq: Two times a day (BID) | ORAL | 0 refills | Status: AC | PRN

## 2019-05-16 NOTE — Patient Instructions (Addendum)
Shortness of breath/pysical deconditioning: Will order chest x ray Will order PT  Loss of taste: Please trial online olfactory re-training  - if taste does not return within the next month will refer to otolaryngologist  Cough: Continue gastroesophageal reflux disease treatment with elevating the head your bed and taking antacids Continue taking over-the-counter antihistamines and nasal fluticasone to help with allergic rhinitis You need to try to suppress your cough to allow your larynx (voice box) to heal.  For three days don't talk, laugh, sing, or clear your throat. Do everything you can to suppress the cough during this time. Use hard candies (sugarless Jolly Ranchers) or non-mint or non-menthol containing cough drops during this time to soothe your throat.  Use a cough suppressant (Delsym or what I have prescribed you) around the clock during this time.  After three days, gradually increase the use of your voice and back off on the cough suppressants.  Hand out given on nutritional rehabilitation  Follow up in 1 month or sooner if needed

## 2019-05-16 NOTE — Assessment & Plan Note (Addendum)
Cough Shortness of breath Fatigue Loss of taste and smell  Assessment: Patient tested positive for Covid in January. She continues to have loss of taste and smell, fatigue, shortness of breath with exertion, and cough. She has not been to see her PCP since she was diagnosed. She has not had a chest x ray. Fatigue and shortness of breath most likely due to physical deconditioning.   Plan:  Shortness of breath/pysical deconditioning: Will order chest x ray Will order PT  Loss of taste: Please trial online olfactory re-training  - if taste does not return within the next month will refer to otolaryngologist  Cough: Continue gastroesophageal reflux disease treatment with elevating the head your bed and taking antacids Continue taking over-the-counter antihistamines and nasal fluticasone to help with allergic rhinitis You need to try to suppress your cough to allow your larynx (voice box) to heal.  For three days don't talk, laugh, sing, or clear your throat. Do everything you can to suppress the cough during this time. Use hard candies (sugarless Jolly Ranchers) or non-mint or non-menthol containing cough drops during this time to soothe your throat.  Use a cough suppressant (Delsym or what I have prescribed you) around the clock during this time.  After three days, gradually increase the use of your voice and back off on the cough suppressants.  Hand out given on nutritional rehabilitation  Follow up in 1 month or sooner if needed

## 2019-05-16 NOTE — Progress Notes (Signed)
@Patient  ID: Chloe Harris, female    DOB: [birth year], 49 y.o.   MRN: [id number]  Chief Complaint  Patient presents with  . Cough  . Shortness of Breath    Referring provider: No ref. provider found   49 year old female with history of depression and anxiety.   HPI  Patient presents today for a post covid care visit. Patient states that she tested positive for Covid on 03/31/19. Her husband is a physician and did a test on her at home. Patient states that she still has ongoing cough and shortness of breath with exertion. She has fatigue and very poor endurance when performing activities of daily living. She states at times she does cough up tan sputum. She also lost her taste and smell and this has not returned completely. Denies f/c/s, n/v/d, hemoptysis, PND, leg swelling. Denies chest pain or edema.     Patient was walked in office today and sats did not drop below 96% for entire walk including stair climbing.     Labs:   Rapid Covid test 03/31/19: Positive      Allergies  Allergen Reactions  . Biaxin [Clarithromycin] Swelling  . Penicillins Hives  . Ciprofloxacin Nausea And Vomiting  . Sulfa Antibiotics Hives and Nausea Only     There is no immunization history on file for this patient.  Past Medical History:  Diagnosis Date  . Anxiety   . Depression     Tobacco History: Social History   Tobacco Use  Smoking Status Never Smoker  Smokeless Tobacco Never Used   Counseling given: Yes   Outpatient Encounter Medications as of 05/16/2019  Medication Sig  . atorvastatin (LIPITOR) 20 MG tablet Take 20 mg by mouth daily.  . clonazePAM (KLONOPIN) 1 MG tablet Take 1 mg by mouth 2 (two) times daily as needed for anxiety (as needed).  . eszopiclone (LUNESTA) 2 MG TABS tablet Take 3 mg by mouth at bedtime as needed for sleep. Take immediately before bedtime  . lamoTRIgine (LAMICTAL) 200 MG tablet Take 200 mg by mouth daily.  . Lorcaserin HCl (BELVIQ) 10 MG TABS  Take 10 mg by mouth 1 day or 1 dose.  . benzonatate (TESSALON) 200 MG capsule Take 1 capsule (200 mg total) by mouth 2 (two) times daily as needed for cough.   No facility-administered encounter medications on file as of 05/16/2019.     Review of Systems  Review of Systems  Constitutional: Positive for activity change (decreased) and fatigue. Negative for fever and unexpected weight change.  HENT:       Loss of taste and smell   Respiratory: Positive for cough and shortness of breath. Negative for wheezing.   Cardiovascular: Negative for chest pain and leg swelling.  All other systems reviewed and are negative.      Physical Exam  BP 106/68   Pulse 94   Temp 97.8 F (36.6 C) (Temporal)   Resp 15   Ht 4\' 11"  (1.499 m)   Wt 174 lb 1.9 oz (79 kg)   SpO2 96%   BMI 35.17 kg/m   Wt Readings from Last 5 Encounters:  05/16/19 174 lb 1.9 oz (79 kg)  04/10/16 167 lb (75.8 kg)     Physical Exam Vitals and nursing note reviewed.  Constitutional:      General: She is not in acute distress.    Appearance: She is well-developed. She is not ill-appearing, toxic-appearing or diaphoretic.  Cardiovascular:     Rate and  Rhythm: Normal rate and regular rhythm.  Pulmonary:     Effort: Pulmonary effort is normal. No respiratory distress.     Breath sounds: Normal breath sounds. No wheezing or rhonchi.     Comments: Dry cough noted in office today.  Musculoskeletal:     Right lower leg: No edema.     Left lower leg: No edema.  Neurological:     Mental Status: She is alert and oriented to person, place, and time.  Psychiatric:        Mood and Affect: Mood normal.       Assessment & Plan:   History of COVID-19 Cough Shortness of breath Fatigue Loss of taste and smell  Assessment: Patient tested positive for Covid in January. She continues to have loss of taste and smell, fatigue, shortness of breath with exertion, and cough. She has not been to see her PCP since she was  diagnosed. She has not had a chest x ray. Fatigue and shortness of breath most likely due to physical deconditioning.   Plan:  Shortness of breath/pysical deconditioning: Will order chest x ray Will order PT  Loss of taste: Please trial online olfactory re-training  - if taste does not return within the next month will refer to otolaryngologist  Cough: Continue gastroesophageal reflux disease treatment with elevating the head your bed and taking antacids Continue taking over-the-counter antihistamines and nasal fluticasone to help with allergic rhinitis You need to try to suppress your cough to allow your larynx (voice box) to heal.  For three days don't talk, laugh, sing, or clear your throat. Do everything you can to suppress the cough during this time. Use hard candies (sugarless Jolly Ranchers) or non-mint or non-menthol containing cough drops during this time to soothe your throat.  Use a cough suppressant (Delsym or what I have prescribed you) around the clock during this time.  After three days, gradually increase the use of your voice and back off on the cough suppressants.  Hand out given on nutritional rehabilitation  Follow up in 1 month or sooner if needed       Fenton Foy, NP 05/16/2019

## 2019-05-26 ENCOUNTER — Ambulatory Visit: Payer: BC Managed Care – PPO | Attending: Internal Medicine

## 2019-05-26 DIAGNOSIS — Z23 Encounter for immunization: Secondary | ICD-10-CM

## 2019-05-26 NOTE — Progress Notes (Signed)
   Covid-19 Vaccination Clinic  Name:  Chloe Harris    MRN: [id number] DOB: [birth year]  05/26/2019  Ms. Chloe Harris was observed post Covid-19 immunization for 15 minutes without incident. She was provided with Vaccine Information Sheet and instruction to access the V-Safe system.   Ms. Chloe Harris was instructed to call 911 with any severe reactions post vaccine: Marland Kitchen Difficulty breathing  . Swelling of face and throat  . A fast heartbeat  . A bad rash all over body  . Dizziness and weakness   Immunizations Administered    Name Date Dose VIS Date Route   Pfizer COVID-19 Vaccine 05/26/2019  1:09 PM 0.3 mL 02/17/2019 Intramuscular   Manufacturer: [place]   Lot: [id number]   [place]: [id number]

## 2019-06-02 DIAGNOSIS — M549 Dorsalgia, unspecified: Secondary | ICD-10-CM | POA: Diagnosis not present

## 2019-06-02 DIAGNOSIS — Z79899 Other long term (current) drug therapy: Secondary | ICD-10-CM | POA: Diagnosis not present

## 2019-06-02 DIAGNOSIS — E785 Hyperlipidemia, unspecified: Secondary | ICD-10-CM | POA: Diagnosis not present

## 2019-06-02 DIAGNOSIS — J4520 Mild intermittent asthma, uncomplicated: Secondary | ICD-10-CM | POA: Diagnosis not present

## 2019-06-02 DIAGNOSIS — Z6834 Body mass index (BMI) 34.0-34.9, adult: Secondary | ICD-10-CM | POA: Diagnosis not present

## 2019-06-12 ENCOUNTER — Encounter: Payer: Self-pay | Admitting: Physical Medicine and Rehabilitation

## 2019-06-14 DIAGNOSIS — F900 Attention-deficit hyperactivity disorder, predominantly inattentive type: Secondary | ICD-10-CM | POA: Diagnosis not present

## 2019-06-14 DIAGNOSIS — F3174 Bipolar disorder, in full remission, most recent episode manic: Secondary | ICD-10-CM | POA: Diagnosis not present

## 2019-06-19 ENCOUNTER — Ambulatory Visit: Payer: BC Managed Care – PPO

## 2019-06-19 ENCOUNTER — Ambulatory Visit: Payer: BC Managed Care – PPO | Attending: Internal Medicine

## 2019-06-19 DIAGNOSIS — Z23 Encounter for immunization: Secondary | ICD-10-CM

## 2019-06-19 NOTE — Progress Notes (Signed)
   Covid-19 Vaccination Clinic  Name:  Chloe Harris    MRN: [id number] DOB: [birth year]  06/19/2019  [name] was observed post Covid-19 immunization for 15 minutes without incident. She was provided with Vaccine Information Sheet and instruction to access the V-Safe system.   [name] was instructed to call 911 with any severe reactions post vaccine: Marland Kitchen Difficulty breathing  . Swelling of face and throat  . A fast heartbeat  . A bad rash all over body  . Dizziness and weakness   Immunizations Administered    Name Date Dose VIS Date Route   Pfizer COVID-19 Vaccine 06/19/2019 10:08 AM 0.3 mL 02/17/2019 Intramuscular   Manufacturer: [place]   Lot: [id number]   [place]: [id number]

## 2019-06-20 ENCOUNTER — Ambulatory Visit: Payer: BC Managed Care – PPO

## 2019-07-11 ENCOUNTER — Other Ambulatory Visit: Payer: Self-pay | Admitting: Nurse Practitioner

## 2019-07-11 DIAGNOSIS — Z8616 Personal history of COVID-19: Secondary | ICD-10-CM

## 2019-07-11 DIAGNOSIS — R5381 Other malaise: Secondary | ICD-10-CM

## 2019-07-12 ENCOUNTER — Other Ambulatory Visit: Payer: Self-pay

## 2019-07-12 ENCOUNTER — Ambulatory Visit (INDEPENDENT_AMBULATORY_CARE_PROVIDER_SITE_OTHER): Payer: BC Managed Care – PPO | Admitting: Nurse Practitioner

## 2019-07-12 ENCOUNTER — Ambulatory Visit
Admission: RE | Admit: 2019-07-12 | Discharge: 2019-07-12 | Disposition: A | Discharge status: Home / Self Care | Payer: BC Managed Care – PPO | Source: Ambulatory Visit | Attending: Nurse Practitioner | Admitting: Nurse Practitioner

## 2019-07-12 VITALS — BP 146/88 | HR 81 | Temp 97.7°F | Ht 59.0 in | Wt 176.5 lb

## 2019-07-12 DIAGNOSIS — L299 Pruritus, unspecified: Secondary | ICD-10-CM

## 2019-07-12 DIAGNOSIS — R0602 Shortness of breath: Secondary | ICD-10-CM | POA: Diagnosis not present

## 2019-07-12 DIAGNOSIS — L659 Nonscarring hair loss, unspecified: Secondary | ICD-10-CM

## 2019-07-12 DIAGNOSIS — R5383 Other fatigue: Secondary | ICD-10-CM

## 2019-07-12 DIAGNOSIS — R481 Agnosia: Secondary | ICD-10-CM

## 2019-07-12 DIAGNOSIS — Z8616 Personal history of COVID-19: Secondary | ICD-10-CM

## 2019-07-12 HISTORY — DX: Nonscarring hair loss, unspecified: L65.9

## 2019-07-12 HISTORY — DX: Other fatigue: R53.83

## 2019-07-12 HISTORY — DX: Pruritus, unspecified: L29.9

## 2019-07-12 NOTE — Progress Notes (Signed)
@Patient  ID: Chloe Harris, female    DOB: 1970-05-26, 49 y.o.   MRN: [id number]  Chief Complaint  Patient presents with  . Alopecia    Patient brought in ziplock bags with the hair she has lost. She started collecting it about 1 month ago. Patient stated this has been going on since she tested positive for COVID back in January.     Referring provider: No ref. provider found  49 year old female with history of anxiety and depression. Patient was diagnosed with Covid in January.   HPI  Patient presents today for post Covid care clinic follow-up.  Patient states that she tested positive for Covid on 04/01/2019.  Her husband is a physician and did a test on her at home.  At last visit patient was ordered a chest x-ray and physical therapy.  She did not complete either of these.  She states that she is still having some fatigue and shortness of breath.  Her main complaint today is hair loss.  She brought into the office today approximately 30 Ziploc bags full of her with dates on them to show how much her she has lost over the past month.  Hair comes out in handfuls daily. She states that her scalp does itch.  Patient states that overall her shortness of breath has somewhat improved but she still has shortness of breath with exertion.  She states that her cough is much improved she only has a slight cough at times.  Patient does complain of ongoing loss of smell.  She also reports that she often smells smoke or like something is burning.  Denies f/c/s, n/v/d, hemoptysis, chest pain or edema.       Allergies  Allergen Reactions  . Biaxin [Clarithromycin] Swelling  . Penicillins Hives  . Ciprofloxacin Nausea And Vomiting  . Sulfa Antibiotics Hives and Nausea Only    Immunization History  Administered Date(s) Administered  . PFIZER SARS-COV-2 Vaccination 05/26/2019, 06/19/2019    Past Medical History:  Diagnosis Date  . Anxiety   . Depression     Tobacco History: Social  History   Tobacco Use  Smoking Status Never Smoker  Smokeless Tobacco Never Used   Counseling given: Yes   Outpatient Encounter Medications as of 07/12/2019  Medication Sig  . atorvastatin (LIPITOR) 20 MG tablet Take 20 mg by mouth daily.  . benzonatate (TESSALON) 200 MG capsule Take 1 capsule (200 mg total) by mouth 2 (two) times daily as needed for cough.  . clonazePAM (KLONOPIN) 1 MG tablet Take 1 mg by mouth 2 (two) times daily as needed for anxiety (as needed).  . eszopiclone (LUNESTA) 2 MG TABS tablet Take 3 mg by mouth at bedtime as needed for sleep. Take immediately before bedtime  . lamoTRIgine (LAMICTAL) 200 MG tablet Take 200 mg by mouth daily.  Marland Kitchen amphetamine-dextroamphetamine (ADDERALL) 20 MG tablet Take 20 mg by mouth daily.  . cyclobenzaprine (FLEXERIL) 10 MG tablet Take 10 mg by mouth 3 (three) times daily.  Marland Kitchen EQUETRO 100 MG CP12 12 hr capsule Take 200 mg by mouth 2 (two) times daily.  . Lorcaserin HCl (BELVIQ) 10 MG TABS Take 10 mg by mouth 1 day or 1 dose.  . montelukast (SINGULAIR) 10 MG tablet Take 10 mg by mouth daily.  . pantoprazole (PROTONIX) 40 MG tablet Take 40 mg by mouth daily.  Marland Kitchen VYVANSE 60 MG capsule Take 60 mg by mouth at bedtime.   No facility-administered encounter medications on file as  of 07/12/2019.     Review of Systems  Review of Systems  Constitutional: Negative.  Negative for chills and fever.  HENT: Negative.   Respiratory: Positive for cough and shortness of breath.   Cardiovascular: Negative.  Negative for chest pain, palpitations and leg swelling.  Gastrointestinal: Negative.   Skin:       Hair loss   Allergic/Immunologic: Negative.   Neurological: Negative.   Psychiatric/Behavioral: Negative.        Physical Exam  BP (!) 146/88 (BP Location: Left Arm, Patient Position: Sitting, Cuff Size: Large)   Pulse 81   Temp 97.7 F (36.5 C)   Ht 4\' 11"  (1.499 m)   Wt 176 lb 8 oz (80.1 kg)   LMP 03/25/2016 (Exact Date)   SpO2 98%    BMI 35.65 kg/m   Wt Readings from Last 5 Encounters:  07/12/19 176 lb 8 oz (80.1 kg)  05/16/19 174 lb 1.9 oz (79 kg)  04/10/16 167 lb (75.8 kg)     Physical Exam Vitals and nursing note reviewed.  Constitutional:      General: She is not in acute distress.    Appearance: She is well-developed.  Cardiovascular:     Rate and Rhythm: Normal rate and regular rhythm.  Pulmonary:     Effort: Pulmonary effort is normal. No respiratory distress.     Breath sounds: Normal breath sounds. No wheezing or rhonchi.  Musculoskeletal:     Right lower leg: No edema.     Left lower leg: No edema.  Neurological:     Mental Status: She is alert and oriented to person, place, and time.  Psychiatric:        Mood and Affect: Mood normal.        Assessment & Plan:   History of COVID-19 Hair loss:  Most people see noticeable hair shedding 2-3 months after Covid. handfulls of hair can come out when you shower or brush your hair. This hair shedding can last for 6-9 months before it stops.   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

## 2019-07-12 NOTE — Patient Instructions (Addendum)
Hair loss:  Most people see noticeable hair shedding 2-3 months after Covid. handfulls of hair can come out when you shower or brush your hair. This hair shedding can last for 6-9 months before it stops.   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

## 2019-07-12 NOTE — Assessment & Plan Note (Signed)
Hair loss:  Most people see noticeable hair shedding 2-3 months after Covid. handfulls of hair can come out when you shower or brush your hair. This hair shedding can last for 6-9 months before it stops.   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

## 2019-07-13 DIAGNOSIS — R5383 Other fatigue: Secondary | ICD-10-CM | POA: Diagnosis not present

## 2019-07-13 DIAGNOSIS — L659 Nonscarring hair loss, unspecified: Secondary | ICD-10-CM | POA: Diagnosis not present

## 2019-07-13 DIAGNOSIS — Z8616 Personal history of COVID-19: Secondary | ICD-10-CM | POA: Diagnosis not present

## 2019-07-13 NOTE — Progress Notes (Signed)
Left voicemail with results, asked to call back with any questions or concerns.

## 2019-07-17 ENCOUNTER — Ambulatory Visit: Payer: Self-pay | Admitting: Neurology

## 2019-07-17 ENCOUNTER — Telehealth: Payer: Self-pay | Admitting: Dermatology

## 2019-07-17 NOTE — Telephone Encounter (Signed)
Patient left message on office voice mail saying that she was returning Bricelyn phone call.

## 2019-07-18 ENCOUNTER — Encounter: Payer: BC Managed Care – PPO | Admitting: Physical Medicine and Rehabilitation

## 2019-07-21 ENCOUNTER — Telehealth: Payer: Self-pay | Admitting: Nurse Practitioner

## 2019-07-21 NOTE — Telephone Encounter (Signed)
Patient notified of results, no additional questions. Verbally understood.

## 2019-07-21 NOTE — Telephone Encounter (Signed)
Please call to let patient know that her vitamin D level was low. Please have her start vitamin D3 800 IU daily. Please have her follow up with her PCP for vitamin D deficiency in 3 months.

## 2019-07-27 ENCOUNTER — Encounter: Payer: Self-pay | Admitting: Neurology

## 2019-07-27 ENCOUNTER — Ambulatory Visit: Payer: BC Managed Care – PPO | Admitting: Neurology

## 2019-07-27 ENCOUNTER — Other Ambulatory Visit: Payer: Self-pay

## 2019-07-27 VITALS — BP 115/77 | HR 85 | Ht <= 58 in | Wt 172.5 lb

## 2019-07-27 DIAGNOSIS — H814 Vertigo of central origin: Secondary | ICD-10-CM | POA: Diagnosis not present

## 2019-07-27 DIAGNOSIS — Z8616 Personal history of COVID-19: Secondary | ICD-10-CM

## 2019-07-27 DIAGNOSIS — R430 Anosmia: Secondary | ICD-10-CM

## 2019-07-27 DIAGNOSIS — Z8679 Personal history of other diseases of the circulatory system: Secondary | ICD-10-CM

## 2019-07-27 DIAGNOSIS — L659 Nonscarring hair loss, unspecified: Secondary | ICD-10-CM | POA: Diagnosis not present

## 2019-07-27 HISTORY — DX: Anosmia: R43.0

## 2019-07-27 HISTORY — DX: Vertigo of central origin: H81.4

## 2019-07-27 HISTORY — DX: Personal history of other diseases of the circulatory system: Z86.79

## 2019-07-27 NOTE — Progress Notes (Signed)
[facility]  PATIENT: Chloe Harris DOB: 03/29/1970  REFERRING DOCTOR OR PCP: Ernestene Kiel, MD SOURCE: Patient, notes from primary care  _________________________________   HISTORICAL  CHIEF COMPLAINT:  Chief Complaint  Patient presents with  . New Patient (Initial Visit)    RM 13, alone. Internal referral from Lazaro Arms, NP at [place] for olfactory agnosia post covid-19 in January 2021. Has hx subdural hematoma about 6-7 years ago. Having issues with her taste (ex: mint tastes like fertilizer). Feeling dizzy and losing her hair since having covid-19.     HISTORY OF PRESENT ILLNESS:  I had the pleasure seeing your patient, Chloe Harris, at [facility] for neurologic consultation regarding issues with vertigo, hemorrhoids and taste/smell since a COVID-19 infection earlier this year.  She is a 49 year old woman who had Covid-19 in January 2021.   She reports a positive test 03/31/2019.   She had a temperature of 103.7 and some shortness of breath for a few days.   Her SaO2 nadir was 86.   She lost her sense of taste and smell.    She did not require hospitalization.   She continues to have altered sense of taste but smell sensation is better.     Additionally, since the infection in January, she feels off balance and dizzy if she looks down or bends over.    This has not significantly improved over the last couple months.   She also notes some difficulty with verbal fluency.  She would note this occasionally before her infection but has noted more over the last couple months.  Additionally, she reports that she is losing her hair.   She continues to lose some hair and has not noted regrowth.     She denies change in vision, numbness or weakness.  She had a history of subdural hematoma after a fall down steps.   She did not lose consciosness but her speech was slurred prompting a trip to the ED.   Her imaging at  at [facility].   She never had a follow up CT.       REVIEW OF SYSTEMS: Constitutional: No fevers, chills, sweats, or change in appetite Eyes: No visual changes, double vision, eye pain Ear, nose and throat: She notes vertigo.  No hearing loss, ear pain, nasal congestion, sore throat Cardiovascular: No chest pain, palpitations. Respiratory:She notes a little bit of shortness of breath.  This was worse during Covid.   GastrointestinaI: No nausea, vomiting, diarrhea, abdominal pain, fecal incontinence Genitourinary: No dysuria, urinary retention or frequency.  No nocturia. Musculoskeletal: No neck pain, back pain Integumentary: No rash, pruritus, skin lesions Neurological: as above Psychiatric: No depression at this time.  No anxiety Endocrine: No palpitations, diaphoresis, change in appetite, change in weigh or increased thirst Hematologic/Lymphatic: No anemia, purpura, petechiae. Allergic/Immunologic: No itchy/runny eyes, nasal congestion, recent allergic reactions, rashes  ALLERGIES: Allergies  Allergen Reactions  . Biaxin [Clarithromycin] Swelling  . Penicillins Hives  . Ciprofloxacin Nausea And Vomiting  . Sulfa Antibiotics Hives and Nausea Only    HOME MEDICATIONS:  Current Outpatient Medications:  .  amphetamine-dextroamphetamine (ADDERALL) 20 MG tablet, Take 20 mg by mouth daily., Disp: , Rfl:  .  atorvastatin (LIPITOR) 20 MG tablet, Take 20 mg by mouth daily., Disp: , Rfl:  .  benzonatate (TESSALON) 200 MG capsule, Take 1 capsule (200 mg total) by mouth 2 (two) times daily as needed for cough., Disp: 20 capsule, Rfl:  0 .  clonazePAM (KLONOPIN) 1 MG tablet, Take 1 mg by mouth 2 (two) times daily as needed for anxiety (as needed)., Disp: , Rfl:  .  cyclobenzaprine (FLEXERIL) 10 MG tablet, Take 10 mg by mouth 3 (three) times daily., Disp: , Rfl:  .  EQUETRO 100 MG CP12 12 hr capsule, Take 200 mg by mouth 2 (two) times daily., Disp: , Rfl:  .  eszopiclone (LUNESTA) 2 MG TABS  tablet, Take 3 mg by mouth at bedtime as needed for sleep. Take immediately before bedtime, Disp: , Rfl:  .  lamoTRIgine (LAMICTAL) 200 MG tablet, Take 200 mg by mouth daily., Disp: , Rfl:  .  Lorcaserin HCl (BELVIQ) 10 MG TABS, Take 10 mg by mouth 1 day or 1 dose., Disp: , Rfl:  .  montelukast (SINGULAIR) 10 MG tablet, Take 10 mg by mouth daily., Disp: , Rfl:  .  pantoprazole (PROTONIX) 40 MG tablet, Take 40 mg by mouth daily., Disp: , Rfl:  .  VYVANSE 60 MG capsule, Take 60 mg by mouth at bedtime., Disp: , Rfl:   PAST MEDICAL HISTORY: Past Medical History:  Diagnosis Date  . Anxiety   . Depression     PAST SURGICAL HISTORY: Past Surgical History:  Procedure Laterality Date  . CESAREAN SECTION  2004  . DENTAL SURGERY  2013   Bone graft  . I & D EXTREMITY Right 04/10/2016   Procedure: IRRIGATION AND DEBRIDEMENT EXTREMITY;  Surgeon: Daryll Brod, MD;  Location: [place];  Service: Orthopedics;  Laterality: Right;  I&D FLEXOR SHEATH RIGHT MIDDLE FINGER  . REPAIR / REINSERT BICEPS TENDON AT ELBOW Right 06/2011    FAMILY HISTORY: History reviewed. No pertinent family history.  SOCIAL HISTORY:  Social History   Socioeconomic History  . Marital status: Unknown    Spouse name: Not on file  . Number of children: Not on file  . Years of education: Not on file  . Highest education level: Not on file  Occupational History  . Not on file  Tobacco Use  . Smoking status: Never Smoker  . Smokeless tobacco: Never Used  Substance and Sexual Activity  . Alcohol use: Yes  . Drug use: Yes    Frequency: 2.0 times per week    Comment: socially  . Sexual activity: Not on file  Other Topics Concern  . Not on file  Social History Narrative   Right handed    Lives with husband, Patrick Jupiter and her son   Caffeine use:tea daily   Social Determinants of Health   Financial Resource Strain:   . Difficulty of Paying Living Expenses:   Food Insecurity:   . Worried About Ship broker in the Last Year:   . Arboriculturist in the Last Year:   Transportation Needs:   . Film/video editor (Medical):   Marland Kitchen Lack of Transportation (Non-Medical):   Physical Activity:   . Days of Exercise per Week:   . Minutes of Exercise per Session:   Stress:   . Feeling of Stress :   Social Connections:   . Frequency of Communication with Friends and Family:   . Frequency of Social Gatherings with Friends and Family:   . Attends Religious Services:   . Active Member of Clubs or Organizations:   . Attends Archivist Meetings:   Marland Kitchen Marital Status:   Intimate Partner Violence:   . Fear of Current or Ex-Partner:   . Emotionally Abused:   .  Physically Abused:   . Sexually Abused:      PHYSICAL EXAM  Vitals:   07/27/19 1321  BP: 115/77  Pulse: 85  Weight: 172 lb 8 oz (78.2 kg)  Height: 4\' 10"  (1.473 m)    Body mass index is 36.05 kg/m.   General: The patient is well-developed and well-nourished and in no acute distress  HEENT:  Head is [place]/AT.  Sclera are anicteric.  Tympanic membranes were intact and ear canals were normal..  Neck: No carotid bruits are noted.  The neck is nontender.  Cardiovascular: The heart has a regular rate and rhythm with a normal S1 and S2. There were no murmurs, gallops or rubs.    Skin: Extremities are without rash or  edema.  Musculoskeletal:  Back is nontender  Neurologic Exam  Mental status: The patient is alert and oriented x 3 at the time of the examination. The patient has apparent normal recent and remote memory, with an apparently normal attention span and concentration ability.   Speech is normal.  Cranial nerves: Extraocular movements are full.  She has symmetric facial strength and sensation..  Trapezius and sternocleidomastoid strength is normal. No dysarthria is noted.  The tongue is midline, and the patient has symmetric elevation of the soft palate. No obvious hearing deficits are noted.  Motor:  Muscle bulk  is normal.   Tone is normal. Strength is  5 / 5 in all 4 extremities.   Sensory: Sensory testing is intact to pinprick, soft touch and vibration sensation in all 4 extremities.  Coordination: Cerebellar testing reveals good finger-nose-finger and heel-to-shin bilaterally.  Gait and station: Station is normal.   Gait is normal. Tandem gait is normal. Romberg is negative.   Reflexes: Deep tendon reflexes are symmetric and normal bilaterally.     Changes in position evoke vertigo.  There was no nystagmus noted with these.    ASSESSMENT AND PLAN  Vertigo of central origin - Plan: MR BRAIN WO CONTRAST  History of COVID-19  Hair loss  Loss of sense of smell  History of subdural hematoma   In summary, Chloe Harris is a 49 year old woman reporting several symptoms since the COVID-19 infection in late January 2021.  Specifically, she notes positional vertigo, loss of smell/taste, some word finding difficulties and loss of hair.  Most likely, the symptoms are related to her infection and will hopefully clear up over the next couple of weeks or months.  To try to help the vertigo improve more rapidly, I instructed her on the Brandt-Daroff exercises and gave her handout.  She may want to consider zinc and selenium for the hair loss.  Because she notes some cognitive issues besides the vertigo, I will check an MRI of the brain to make sure that there is not a structural, ischemic or demyelinating etiology of her symptoms.  She will return to see [place] as needed and is advised to call if she has new or worsening neurologic symptoms.  Thank you for asking me to see Chloe Harris.  Please let me know if I can be of further assistance with her or other patients in the future.   Kadisha Goodine A. Felecia Shelling, MD, [facility] [id number], [id number] PM Certified in Neurology, Clinical Neurophysiology, Sleep Medicine and Neuroimaging  [facility] [address] [place], [place] [address] [phone]

## 2019-07-28 DIAGNOSIS — F902 Attention-deficit hyperactivity disorder, combined type: Secondary | ICD-10-CM | POA: Diagnosis not present

## 2019-07-28 DIAGNOSIS — F419 Anxiety disorder, unspecified: Secondary | ICD-10-CM | POA: Diagnosis not present

## 2019-07-28 DIAGNOSIS — F3174 Bipolar disorder, in full remission, most recent episode manic: Secondary | ICD-10-CM | POA: Diagnosis not present

## 2019-07-28 DIAGNOSIS — Z79899 Other long term (current) drug therapy: Secondary | ICD-10-CM | POA: Diagnosis not present

## 2019-08-08 ENCOUNTER — Telehealth: Payer: Self-pay | Admitting: Neurology

## 2019-08-08 NOTE — Telephone Encounter (Signed)
Chloe Harris Auth: [id number] (exp. 08/04/19 to 01/30/20) pt is scheduled at [facility] for 08/10/19 arrival time is 4:15 pm LVM for pt to be aware. I also left their number of [phone] incase she needed to r/s.

## 2019-08-10 DIAGNOSIS — H814 Vertigo of central origin: Secondary | ICD-10-CM | POA: Diagnosis not present

## 2019-08-10 DIAGNOSIS — R42 Dizziness and giddiness: Secondary | ICD-10-CM | POA: Diagnosis not present

## 2019-08-14 ENCOUNTER — Ambulatory Visit: Payer: BC Managed Care – PPO

## 2019-08-19 ENCOUNTER — Telehealth: Payer: Self-pay | Admitting: Neurology

## 2019-08-19 NOTE — Telephone Encounter (Signed)
Please let her know that I took a look at the MRI of the brain that she had at [facility] 08/08/2019 and compared it to the MRI of the brain from 2008.    Her MRI is normal for age.  There is no significant change compared to 2008.

## 2019-08-21 NOTE — Telephone Encounter (Signed)
I called pt to discuss. No answer, left a message asking her to call me back. 

## 2019-08-28 DIAGNOSIS — M545 Low back pain: Secondary | ICD-10-CM | POA: Diagnosis not present

## 2019-08-28 DIAGNOSIS — Z79899 Other long term (current) drug therapy: Secondary | ICD-10-CM | POA: Diagnosis not present

## 2019-08-28 DIAGNOSIS — M546 Pain in thoracic spine: Secondary | ICD-10-CM | POA: Diagnosis not present

## 2019-08-28 DIAGNOSIS — M549 Dorsalgia, unspecified: Secondary | ICD-10-CM | POA: Diagnosis not present

## 2019-08-28 NOTE — Telephone Encounter (Signed)
I called pt, no answer. Left a detailed message per DPR advising her of MRI results and asked her to call back with questions or concerns.

## 2019-08-31 DIAGNOSIS — S22080A Wedge compression fracture of T11-T12 vertebra, initial encounter for closed fracture: Secondary | ICD-10-CM | POA: Diagnosis not present

## 2019-08-31 DIAGNOSIS — M549 Dorsalgia, unspecified: Secondary | ICD-10-CM | POA: Diagnosis not present

## 2019-09-13 DIAGNOSIS — F900 Attention-deficit hyperactivity disorder, predominantly inattentive type: Secondary | ICD-10-CM | POA: Diagnosis not present

## 2019-10-06 DIAGNOSIS — E2839 Other primary ovarian failure: Secondary | ICD-10-CM | POA: Diagnosis not present

## 2019-10-06 DIAGNOSIS — Z780 Asymptomatic menopausal state: Secondary | ICD-10-CM | POA: Diagnosis not present

## 2019-10-11 ENCOUNTER — Encounter: Payer: Self-pay | Admitting: Dermatology

## 2019-10-11 ENCOUNTER — Ambulatory Visit: Payer: BC Managed Care – PPO | Admitting: Dermatology

## 2019-10-11 ENCOUNTER — Other Ambulatory Visit: Payer: Self-pay

## 2019-10-11 DIAGNOSIS — L650 Telogen effluvium: Secondary | ICD-10-CM | POA: Diagnosis not present

## 2019-10-11 DIAGNOSIS — D225 Melanocytic nevi of trunk: Secondary | ICD-10-CM

## 2019-10-11 DIAGNOSIS — D229 Melanocytic nevi, unspecified: Secondary | ICD-10-CM

## 2019-10-11 NOTE — Patient Instructions (Addendum)
First visit for a delightful young woman Chloe Harris who had fairly severe Covid in January 2021 and who has been shedding hair profusely since April of this year. She was kind enough to bring me lots of baggies with tremendous amount of human hair which I presume is hers. She showed me pictures of her hair pre-Covid and she did have a thick head of hair even though the pictures do not show the roots. currently there is a modestly positive hair pull. 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 Chloe Harris already knew the diagnosis and she knew there is no scientific evidence that any therapy changes the 90% likelihood that this will self-correct typically in a year. We discussed the concept of a chronic telogen effluvium; I have not heard of this happening post Covid but it is impossible to exclude. There is no indication to do lab work for her hair loss such as thyroid. there is no indication to do scalp biopsy, there is no indication for her to take scalp vitamins or rubbing minoxidil or any other unproven therapy. She seemed quite comfortable with this. She was kind enough to also let me do a brief check of the fair skin on her back which is perfectly healthy with no atypical moles. She does have a family history of melanoma so I did suggest perhaps annually we would do a general skin check. I am most interested to know come January or February of next year whether the hair shedding has stopped and I have asked her to contact me about this.

## 2019-11-09 DIAGNOSIS — F419 Anxiety disorder, unspecified: Secondary | ICD-10-CM | POA: Diagnosis not present

## 2019-11-09 DIAGNOSIS — F338 Other recurrent depressive disorders: Secondary | ICD-10-CM | POA: Diagnosis not present

## 2019-11-09 DIAGNOSIS — Z79899 Other long term (current) drug therapy: Secondary | ICD-10-CM | POA: Diagnosis not present

## 2019-11-09 DIAGNOSIS — F902 Attention-deficit hyperactivity disorder, combined type: Secondary | ICD-10-CM | POA: Diagnosis not present

## 2019-11-25 ENCOUNTER — Encounter: Payer: Self-pay | Admitting: Dermatology

## 2019-11-25 NOTE — Progress Notes (Signed)
   New Patient   Subjective  Chloe Harris is a 49 y.o. female who presents for the following: New Patient (Initial Visit) (Pt stated---loose hair, dry/itching scalp since had the COVID in January.).  Hair loss Location:  Duration: January 2021 Quality:  Associated Signs/Symptoms: Modifying Factors:  Severity:  Timing: Onset post Covid infection Context:    The following portions of the chart were reviewed this encounter and updated as appropriate: Tobacco  Allergies  Meds  Problems  Med Hx  Surg Hx  Fam Hx      Objective  Well appearing patient in no apparent distress; mood and affect are within normal limits.  A focused examination was performed including Head, neck, nails.. Relevant physical exam findings are noted in the Assessment and Plan.   Assessment & Plan  Telogen effluvium (2) Mid Parietal Scalp  No evaluation or intervention presently indicated.  Patient to contact me and 4 to 5 months for follow-up status.  Nevus Mid Back  Self examined twice annually

## 2019-12-13 DIAGNOSIS — F900 Attention-deficit hyperactivity disorder, predominantly inattentive type: Secondary | ICD-10-CM | POA: Diagnosis not present

## 2019-12-13 DIAGNOSIS — F3174 Bipolar disorder, in full remission, most recent episode manic: Secondary | ICD-10-CM | POA: Diagnosis not present

## 2019-12-15 DIAGNOSIS — E785 Hyperlipidemia, unspecified: Secondary | ICD-10-CM | POA: Diagnosis not present

## 2019-12-15 DIAGNOSIS — J4520 Mild intermittent asthma, uncomplicated: Secondary | ICD-10-CM | POA: Diagnosis not present

## 2019-12-15 DIAGNOSIS — Z01419 Encounter for gynecological examination (general) (routine) without abnormal findings: Secondary | ICD-10-CM | POA: Diagnosis not present

## 2019-12-15 DIAGNOSIS — N912 Amenorrhea, unspecified: Secondary | ICD-10-CM | POA: Diagnosis not present

## 2019-12-15 DIAGNOSIS — M549 Dorsalgia, unspecified: Secondary | ICD-10-CM | POA: Diagnosis not present

## 2019-12-15 DIAGNOSIS — Z124 Encounter for screening for malignant neoplasm of cervix: Secondary | ICD-10-CM | POA: Diagnosis not present

## 2019-12-15 DIAGNOSIS — Z1231 Encounter for screening mammogram for malignant neoplasm of breast: Secondary | ICD-10-CM | POA: Diagnosis not present

## 2019-12-15 DIAGNOSIS — N951 Menopausal and female climacteric states: Secondary | ICD-10-CM | POA: Diagnosis not present

## 2020-01-01 DIAGNOSIS — E785 Hyperlipidemia, unspecified: Secondary | ICD-10-CM | POA: Diagnosis not present

## 2020-01-01 DIAGNOSIS — Z79899 Other long term (current) drug therapy: Secondary | ICD-10-CM | POA: Diagnosis not present

## 2020-02-07 DIAGNOSIS — F902 Attention-deficit hyperactivity disorder, combined type: Secondary | ICD-10-CM | POA: Diagnosis not present

## 2020-02-07 DIAGNOSIS — Z79899 Other long term (current) drug therapy: Secondary | ICD-10-CM | POA: Diagnosis not present

## 2020-02-07 DIAGNOSIS — F419 Anxiety disorder, unspecified: Secondary | ICD-10-CM | POA: Diagnosis not present

## 2020-03-14 DIAGNOSIS — F900 Attention-deficit hyperactivity disorder, predominantly inattentive type: Secondary | ICD-10-CM | POA: Diagnosis not present

## 2020-04-05 DIAGNOSIS — B9689 Other specified bacterial agents as the cause of diseases classified elsewhere: Secondary | ICD-10-CM | POA: Diagnosis not present

## 2020-04-05 DIAGNOSIS — N3001 Acute cystitis with hematuria: Secondary | ICD-10-CM | POA: Diagnosis not present

## 2020-04-05 DIAGNOSIS — N281 Cyst of kidney, acquired: Secondary | ICD-10-CM | POA: Diagnosis not present

## 2020-04-05 DIAGNOSIS — R1012 Left upper quadrant pain: Secondary | ICD-10-CM | POA: Diagnosis not present

## 2020-04-05 DIAGNOSIS — R109 Unspecified abdominal pain: Secondary | ICD-10-CM | POA: Diagnosis not present

## 2020-04-05 DIAGNOSIS — Z79899 Other long term (current) drug therapy: Secondary | ICD-10-CM | POA: Diagnosis not present

## 2020-05-02 DIAGNOSIS — E785 Hyperlipidemia, unspecified: Secondary | ICD-10-CM | POA: Diagnosis not present

## 2020-05-02 DIAGNOSIS — M549 Dorsalgia, unspecified: Secondary | ICD-10-CM | POA: Diagnosis not present

## 2020-05-02 DIAGNOSIS — R319 Hematuria, unspecified: Secondary | ICD-10-CM | POA: Diagnosis not present

## 2020-05-02 DIAGNOSIS — J4520 Mild intermittent asthma, uncomplicated: Secondary | ICD-10-CM | POA: Diagnosis not present

## 2020-05-02 DIAGNOSIS — K219 Gastro-esophageal reflux disease without esophagitis: Secondary | ICD-10-CM | POA: Diagnosis not present

## 2020-05-08 DIAGNOSIS — Z79899 Other long term (current) drug therapy: Secondary | ICD-10-CM | POA: Diagnosis not present

## 2020-05-08 DIAGNOSIS — F902 Attention-deficit hyperactivity disorder, combined type: Secondary | ICD-10-CM | POA: Diagnosis not present

## 2020-06-13 DIAGNOSIS — F3174 Bipolar disorder, in full remission, most recent episode manic: Secondary | ICD-10-CM | POA: Diagnosis not present

## 2020-06-14 DIAGNOSIS — B029 Zoster without complications: Secondary | ICD-10-CM | POA: Diagnosis not present

## 2020-07-04 DIAGNOSIS — E785 Hyperlipidemia, unspecified: Secondary | ICD-10-CM | POA: Diagnosis not present

## 2020-07-04 DIAGNOSIS — Z79899 Other long term (current) drug therapy: Secondary | ICD-10-CM | POA: Diagnosis not present

## 2020-07-25 DIAGNOSIS — F3174 Bipolar disorder, in full remission, most recent episode manic: Secondary | ICD-10-CM | POA: Diagnosis not present

## 2020-07-28 DIAGNOSIS — Z8639 Personal history of other endocrine, nutritional and metabolic disease: Secondary | ICD-10-CM | POA: Diagnosis not present

## 2020-07-28 DIAGNOSIS — F319 Bipolar disorder, unspecified: Secondary | ICD-10-CM | POA: Diagnosis not present

## 2020-07-28 DIAGNOSIS — E785 Hyperlipidemia, unspecified: Secondary | ICD-10-CM | POA: Diagnosis not present

## 2020-07-28 DIAGNOSIS — F419 Anxiety disorder, unspecified: Secondary | ICD-10-CM | POA: Diagnosis not present

## 2020-07-28 DIAGNOSIS — Z794 Long term (current) use of insulin: Secondary | ICD-10-CM | POA: Diagnosis not present

## 2020-07-28 DIAGNOSIS — Z79899 Other long term (current) drug therapy: Secondary | ICD-10-CM | POA: Diagnosis not present

## 2020-07-28 DIAGNOSIS — R079 Chest pain, unspecified: Secondary | ICD-10-CM | POA: Diagnosis not present

## 2020-07-28 DIAGNOSIS — E7800 Pure hypercholesterolemia, unspecified: Secondary | ICD-10-CM | POA: Diagnosis not present

## 2020-07-28 DIAGNOSIS — R0789 Other chest pain: Secondary | ICD-10-CM | POA: Diagnosis not present

## 2020-07-29 DIAGNOSIS — R079 Chest pain, unspecified: Secondary | ICD-10-CM | POA: Diagnosis not present

## 2020-08-07 DIAGNOSIS — E785 Hyperlipidemia, unspecified: Secondary | ICD-10-CM | POA: Diagnosis not present

## 2020-08-07 DIAGNOSIS — Z6835 Body mass index (BMI) 35.0-35.9, adult: Secondary | ICD-10-CM | POA: Diagnosis not present

## 2020-08-07 DIAGNOSIS — R079 Chest pain, unspecified: Secondary | ICD-10-CM | POA: Diagnosis not present

## 2020-08-07 DIAGNOSIS — M549 Dorsalgia, unspecified: Secondary | ICD-10-CM | POA: Diagnosis not present

## 2020-08-12 DIAGNOSIS — Z79899 Other long term (current) drug therapy: Secondary | ICD-10-CM | POA: Diagnosis not present

## 2020-08-12 DIAGNOSIS — F902 Attention-deficit hyperactivity disorder, combined type: Secondary | ICD-10-CM | POA: Diagnosis not present

## 2020-08-19 ENCOUNTER — Encounter: Payer: Self-pay | Admitting: *Deleted

## 2020-08-19 ENCOUNTER — Encounter: Payer: Self-pay | Admitting: Cardiology

## 2020-09-18 DIAGNOSIS — Z6836 Body mass index (BMI) 36.0-36.9, adult: Secondary | ICD-10-CM | POA: Diagnosis not present

## 2020-09-18 DIAGNOSIS — M542 Cervicalgia: Secondary | ICD-10-CM | POA: Diagnosis not present

## 2020-09-23 ENCOUNTER — Other Ambulatory Visit: Payer: Self-pay

## 2020-09-23 ENCOUNTER — Encounter: Payer: Self-pay | Admitting: Cardiology

## 2020-09-23 ENCOUNTER — Ambulatory Visit: Payer: BC Managed Care – PPO | Admitting: Cardiology

## 2020-09-23 VITALS — BP 118/78 | HR 75 | Ht <= 58 in | Wt 177.0 lb

## 2020-09-23 DIAGNOSIS — R0600 Dyspnea, unspecified: Secondary | ICD-10-CM | POA: Diagnosis not present

## 2020-09-23 DIAGNOSIS — R0609 Other forms of dyspnea: Secondary | ICD-10-CM

## 2020-09-23 DIAGNOSIS — E785 Hyperlipidemia, unspecified: Secondary | ICD-10-CM

## 2020-09-23 DIAGNOSIS — R0789 Other chest pain: Secondary | ICD-10-CM | POA: Diagnosis not present

## 2020-09-23 NOTE — Patient Instructions (Addendum)
Medication Instructions:  Your physician recommends that you continue on your current medications as directed. Please refer to the Current Medication list given to you today.  *If you need a refill on your cardiac medications before your next appointment, please call your pharmacy*   Lab Work: None If you have labs (blood work) drawn today and your tests are completely normal, you will receive your results only by: [place] (if you have MyChart) OR A paper copy in the mail If you have any lab test that is abnormal or we need to change your treatment, we will call you to review the results.   Testing/Procedures: Coronary Calcium Scan - order will be placed and there will be a $99.00 out of pocket fee. They will call to schedule the appointment.     Follow-Up: At [facility], you and your health needs are our priority.  As part of our continuing mission to provide you with exceptional heart care, we have created designated Provider Care Teams.  These Care Teams include your primary Cardiologist (physician) and Advanced Practice Providers (APPs -  Physician Assistants and Nurse Practitioners) who all work together to provide you with the care you need, when you need it.  We recommend signing up for the patient portal called "MyChart".  Sign up information is provided on this After Visit Summary.  MyChart is used to connect with patients for Virtual Visits (Telemedicine).  Patients are able to view lab/test results, encounter notes, upcoming appointments, etc.  Non-urgent messages can be sent to your provider as well.   To learn more about what you can do with MyChart, go to [email or web address].    Your next appointment:   2 month(s)  The format for your next appointment:   In Person  Provider:   Jenne Campus, MD   Other Instructions

## 2020-09-23 NOTE — Progress Notes (Signed)
Cardiology Consultation:    Date:  09/23/2020   ID:  Chloe Harris, DOB [birth year], MRN [id number]  PCP:  Ernestene Kiel, MD  Cardiologist:  Jenne Campus, MD   Referring MD: Ernestene Kiel, MD   Chief Complaint  Patient presents with   chest pressure x14months    History of Present Illness:    Chloe Harris is a 50 y.o. female who is being seen today for the evaluation of chest pain at the request of Ernestene Kiel, MD. with past medical history significant for dyslipidemia, in May she end up going to the hospital because of episode of chest pain.  She described this as a heavy sensation in the chest lasting for many hours.  She ruled out for myocardial infarction, she eventually end up having stress test done in form of exercise treadmill stress test, she walked 8 minutes on the treadmill accomplished 9 METS stress test was negative without EKG changes.  When she got chest pain she did not have any shortness of breath she did not have any sweating she did not have any palpitations.  Since the time of hospitalization she had no more chest pain.  She travels a lot and she has no difficulty walking and traveling. She does not smoke Does not exercise on the regular basis She is not on any special diet She does have family history of heart problem but no coronary artery disease and not premature.  Past Medical History:  Diagnosis Date   Abdominal pannus 06/21/2015   ADD (attention deficit disorder)    Anxiety    Cat bite of right hand including fingers with infection 04/10/2016   Cough 05/16/2019   DDD (degenerative disc disease), cervical    Degenerative joint disease    Depression    Dorsalgia    Fatigue 07/12/2019   Hair loss 07/12/2019   History of COVID-19 05/16/2019   History of subdural hematoma 07/27/2019   Hyperlipidemia    Infection of flexor tendon sheath 04/10/2016   Insomnia    Itchy scalp 07/12/2019   Loss of sense of smell 07/27/2019    Pain in finger of right hand 04/13/2016   Shortness of breath 05/16/2019   Stiffness of finger joint of right hand 04/20/2016   Symptomatic mammary hypertrophy 06/21/2015   Unspecified asthma, uncomplicated    Vertigo of central origin 07/27/2019    Past Surgical History:  Procedure Laterality Date   CESAREAN SECTION  2004   DENTAL SURGERY  2013   Bone graft   I & D EXTREMITY Right 04/10/2016   Procedure: IRRIGATION AND DEBRIDEMENT EXTREMITY;  Surgeon: Daryll Brod, MD;  Location: [facility];  Service: Orthopedics;  Laterality: Right;  I&D FLEXOR SHEATH RIGHT MIDDLE FINGER   REPAIR / REINSERT BICEPS TENDON AT ELBOW Right 06/2011    Current Medications: Current Meds  Medication Sig   amphetamine-dextroamphetamine (ADDERALL XR) 25 MG 24 hr capsule Take 25 mg by mouth every morning.   atorvastatin (LIPITOR) 20 MG tablet Take 20 mg by mouth daily.   benzonatate (TESSALON) 200 MG capsule Take 1 capsule (200 mg total) by mouth 2 (two) times daily as needed for cough.   clonazePAM (KLONOPIN) 0.5 MG tablet Take 1 mg by mouth 3 (three) times daily as needed for anxiety (as needed).   cyclobenzaprine (FLEXERIL) 10 MG tablet Take 5-10 mg by mouth 3 (three) times daily as needed for muscle spasms.   EPINEPHrine 0.3 mg/0.3 mL IJ SOAJ injection Inject 0.3 mg  into the muscle as needed for anaphylaxis.   Eszopiclone 3 MG TABS Take 3 mg by mouth at bedtime as needed for sleep. Take immediately before bedtime   fluconazole (DIFLUCAN) 150 MG tablet Take 150 mg by mouth as needed (yeast infections).   HYDROcodone-acetaminophen (NORCO) 10-325 MG tablet Take 1 tablet by mouth every 6 (six) hours as needed for moderate pain.   ipratropium-albuterol (DUONEB) 0.5-2.5 (3) MG/3ML SOLN Take 3 mLs by nebulization every 6 (six) hours as needed (shortness of breath).   LamoTRIgine 300 MG TB24 24 hour tablet Take 300 mg by mouth daily.   montelukast (SINGULAIR) 10 MG tablet Take 10 mg by mouth at bedtime  as needed (allergies).   omeprazole (PRILOSEC) 20 MG capsule Take 20 mg by mouth daily.   pantoprazole (PROTONIX) 40 MG tablet Take 40 mg by mouth as needed (Indigestion).   Promethazine-DM 6.25-15 MG/5ML SOLN Take 5 mLs by mouth every 6 (six) hours as needed (cough).   triamcinolone ointment (KENALOG) 0.1 % Apply 1 application topically 2 (two) times daily as needed (Eczema).     Allergies:   Biaxin [clarithromycin], Penicillins, Ciprofloxacin, and Sulfa antibiotics   Social History   Socioeconomic History   Marital status: Unknown    Spouse name: Not on file   Number of children: Not on file   Years of education: Not on file   Highest education level: Not on file  Occupational History   Not on file  Tobacco Use   Smoking status: Never   Smokeless tobacco: Never  Substance and Sexual Activity   Alcohol use: Yes   Drug use: Yes    Frequency: 2.0 times per week    Comment: socially   Sexual activity: Not on file  Other Topics Concern   Not on file  Social History Narrative   Right handed    Lives with husband, Patrick Jupiter and her son   Caffeine use:tea daily   Social Determinants of Health   Financial Resource Strain: Not on file  Food Insecurity: Not on file  Transportation Needs: Not on file  Physical Activity: Not on file  Stress: Not on file  Social Connections: Not on file     Family History: The patient's family history includes Breast cancer in her mother; CAD in her father, maternal grandfather, and paternal grandfather; Dementia in her paternal grandmother; Glaucoma in her maternal grandfather; Heart attack in her maternal grandfather; Hypertension in her father, mother, and paternal grandmother; Melanoma in her mother; Prostate cancer in her paternal grandfather; Skin cancer in her mother; Valvular heart disease in her maternal grandmother. ROS:   Please see the history of present illness.    All 14 point review of systems negative except as described per history  of present illness.  EKGs/Labs/Other Studies Reviewed:    The following studies were reviewed today: I did review record from the hospital including stress test  EKG:  EKG is  ordered today.  The ekg ordered today demonstrates normal sinus rhythm, normal P interval, normal QS complex duration morphology no ST segment changes  Recent Labs: No results found for requested labs within last 8760 hours.  Recent Lipid Panel No results found for: CHOL, TRIG, HDL, CHOLHDL, VLDL, LDLCALC, LDLDIRECT  Physical Exam:    VS:  BP 118/78 (BP Location: Right Arm, Patient Position: Sitting)   Pulse 75   Ht 4\' 10"  (1.473 m)   Wt 177 lb (80.3 kg)   LMP 03/25/2016 (Exact Date)   SpO2 98%  BMI 36.99 kg/m     Wt Readings from Last 3 Encounters:  09/23/20 177 lb (80.3 kg)  08/07/20 175 lb (79.4 kg)  07/27/19 172 lb 8 oz (78.2 kg)     GEN:  Well nourished, well developed in no acute distress HEENT: Normal NECK: No JVD; No carotid bruits LYMPHATICS: No lymphadenopathy CARDIAC: RRR, no murmurs, no rubs, no gallops RESPIRATORY:  Clear to auscultation without rales, wheezing or rhonchi  ABDOMEN: Soft, non-tender, non-distended MUSCULOSKELETAL:  No edema; No deformity  SKIN: Warm and dry NEUROLOGIC:  Alert and oriented x 3 PSYCHIATRIC:  Normal affect   ASSESSMENT:    1. Dyspnea on exertion   2. Atypical chest pain   3. Dyslipidemia    PLAN:    In order of problems listed above:  Atypical chest pain with stress test being normal.  We did talk in length about his options for the situation with talk about simply observation, we did talk about potentially doing calcium score to help guide therapy including potential aspirin, we did talk also about coronary CT angio.  We elected to do calcium score.  His calcium score of 0 we will just simply continue present management if calcium score is positive depends on value it may include intensification of therapy including antiplatelets therapy versus  maybe even coronary CT angio.  We did talk about healthy lifestyle which she is very much aware and she is upset with herself that she cannot maintain it right now.  We did talk about need to exercise and regular basis and good diet.  She said she will try to do it after she comes back from cruise that she is going to ask week. Dyspnea on exertion multifactorial.  Plan as outlined above. Dyslipidemia: She is on statin which I will continue.  I did review her K PN which show me LDL of 87 HDL 77.  That something that need to be updated.   Medication Adjustments/Labs and Tests Ordered: Current medicines are reviewed at length with the patient today.  Concerns regarding medicines are outlined above.  No orders of the defined types were placed in this encounter.  No orders of the defined types were placed in this encounter.   Signed, Park Liter, MD, [facility]. 09/23/2020 2:56 PM    [place]

## 2020-09-23 NOTE — Addendum Note (Signed)
Addended by: Orvan July on: 09/23/2020 03:21 PM   Modules accepted: Orders

## 2020-09-24 ENCOUNTER — Telehealth: Payer: Self-pay

## 2020-09-24 NOTE — Telephone Encounter (Signed)
Called patient in regards to her CT calcium score. She states she is on a cruise and will be back next Monday. She will reach out then.

## 2020-10-09 DIAGNOSIS — F3174 Bipolar disorder, in full remission, most recent episode manic: Secondary | ICD-10-CM | POA: Diagnosis not present

## 2020-10-14 ENCOUNTER — Ambulatory Visit: Payer: BC Managed Care – PPO | Admitting: Dermatology

## 2020-10-16 ENCOUNTER — Ambulatory Visit (INDEPENDENT_AMBULATORY_CARE_PROVIDER_SITE_OTHER)
Admission: RE | Admit: 2020-10-16 | Discharge: 2020-10-16 | Disposition: A | Discharge status: Home / Self Care | Payer: Self-pay | Source: Ambulatory Visit | Attending: Cardiology | Admitting: Cardiology

## 2020-10-16 ENCOUNTER — Other Ambulatory Visit: Payer: Self-pay

## 2020-10-16 DIAGNOSIS — R0600 Dyspnea, unspecified: Secondary | ICD-10-CM

## 2020-10-16 DIAGNOSIS — R0609 Other forms of dyspnea: Secondary | ICD-10-CM

## 2020-10-16 DIAGNOSIS — R0789 Other chest pain: Secondary | ICD-10-CM

## 2020-10-18 ENCOUNTER — Telehealth: Payer: Self-pay | Admitting: Cardiology

## 2020-10-18 NOTE — Telephone Encounter (Signed)
[facility] has mailed out the requested CD containing records from CT requested by the patient. AO 10/18/20

## 2020-10-21 ENCOUNTER — Other Ambulatory Visit: Payer: Self-pay

## 2020-10-21 ENCOUNTER — Encounter: Payer: Self-pay | Admitting: Dermatology

## 2020-10-21 ENCOUNTER — Ambulatory Visit: Payer: BC Managed Care – PPO | Admitting: Dermatology

## 2020-10-21 DIAGNOSIS — D2372 Other benign neoplasm of skin of left lower limb, including hip: Secondary | ICD-10-CM

## 2020-10-21 DIAGNOSIS — D23111 Other benign neoplasm of skin of right upper eyelid, including canthus: Secondary | ICD-10-CM

## 2020-10-21 DIAGNOSIS — D369 Benign neoplasm, unspecified site: Secondary | ICD-10-CM

## 2020-10-21 DIAGNOSIS — Z1283 Encounter for screening for malignant neoplasm of skin: Secondary | ICD-10-CM | POA: Diagnosis not present

## 2020-10-21 DIAGNOSIS — D239 Other benign neoplasm of skin, unspecified: Secondary | ICD-10-CM

## 2020-10-28 ENCOUNTER — Encounter: Payer: Self-pay | Admitting: Dermatology

## 2020-10-28 NOTE — Progress Notes (Signed)
   Follow-Up Visit   Subjective  Chloe Harris is a 50 y.o. female who presents for the following: Annual Exam (No new concerns).  Annual skin check Location:  Duration:  Quality:  Associated Signs/Symptoms: Modifying Factors:  Severity:  Timing: Context:   Objective  Well appearing patient in no apparent distress; mood and affect are within normal limits. Mid Back Full body skin exam. No non moles skin cancers or melanoma.  Left Lower Leg - Posterior 64m firm pink dermal papule.  Right Upper Eyelid 268msquamous papilloma, tag vs wart. Patient can decide if she would like future removal.    A full examination was performed including scalp, head, eyes, ears, nose, lips, neck, chest, axillae, abdomen, back, buttocks, bilateral upper extremities, bilateral lower extremities, hands, feet, fingers, toes, fingernails, and toenails. All findings within normal limits unless otherwise noted below.Areas beneath undergarments not fully examined.   Assessment & Plan    Screening for malignant neoplasm of skin Mid Back  Annual skin examination. Encouraged to self examine twice yearly. Continued ultraviolet protection.  Dermatofibroma Left Lower Leg - Posterior  Benign no treatment needed if stable.  Papilloma Right Upper Eyelid      I, StLavonna MonarchMD, have reviewed all documentation for this visit.  The documentation on 10/28/20 for the exam, diagnosis, procedures, and orders are all accurate and complete.

## 2020-11-12 DIAGNOSIS — Z79899 Other long term (current) drug therapy: Secondary | ICD-10-CM | POA: Diagnosis not present

## 2020-11-12 DIAGNOSIS — F902 Attention-deficit hyperactivity disorder, combined type: Secondary | ICD-10-CM | POA: Diagnosis not present

## 2021-01-08 DIAGNOSIS — F3174 Bipolar disorder, in full remission, most recent episode manic: Secondary | ICD-10-CM | POA: Diagnosis not present

## 2021-02-11 DIAGNOSIS — F902 Attention-deficit hyperactivity disorder, combined type: Secondary | ICD-10-CM | POA: Diagnosis not present

## 2021-02-11 DIAGNOSIS — Z79899 Other long term (current) drug therapy: Secondary | ICD-10-CM | POA: Diagnosis not present

## 2021-07-10 DIAGNOSIS — D485 Neoplasm of uncertain behavior of skin: Secondary | ICD-10-CM | POA: Diagnosis not present

## 2021-07-10 DIAGNOSIS — C44319 Basal cell carcinoma of skin of other parts of face: Secondary | ICD-10-CM | POA: Diagnosis not present

## 2021-07-16 DIAGNOSIS — C44319 Basal cell carcinoma of skin of other parts of face: Secondary | ICD-10-CM | POA: Diagnosis not present

## 2021-08-13 DIAGNOSIS — F902 Attention-deficit hyperactivity disorder, combined type: Secondary | ICD-10-CM | POA: Diagnosis not present

## 2021-08-13 DIAGNOSIS — Z79899 Other long term (current) drug therapy: Secondary | ICD-10-CM | POA: Diagnosis not present

## 2021-09-11 DIAGNOSIS — F3174 Bipolar disorder, in full remission, most recent episode manic: Secondary | ICD-10-CM | POA: Diagnosis not present

## 2021-10-21 ENCOUNTER — Ambulatory Visit: Payer: BC Managed Care – PPO | Admitting: Dermatology

## 2021-11-04 DIAGNOSIS — L814 Other melanin hyperpigmentation: Secondary | ICD-10-CM | POA: Diagnosis not present

## 2021-11-04 DIAGNOSIS — L209 Atopic dermatitis, unspecified: Secondary | ICD-10-CM | POA: Diagnosis not present

## 2021-11-04 DIAGNOSIS — L219 Seborrheic dermatitis, unspecified: Secondary | ICD-10-CM | POA: Diagnosis not present

## 2021-11-04 DIAGNOSIS — D225 Melanocytic nevi of trunk: Secondary | ICD-10-CM | POA: Diagnosis not present

## 2021-11-12 DIAGNOSIS — Z79899 Other long term (current) drug therapy: Secondary | ICD-10-CM | POA: Diagnosis not present

## 2021-11-12 DIAGNOSIS — F902 Attention-deficit hyperactivity disorder, combined type: Secondary | ICD-10-CM | POA: Diagnosis not present

## 2021-11-13 DIAGNOSIS — F902 Attention-deficit hyperactivity disorder, combined type: Secondary | ICD-10-CM | POA: Diagnosis not present

## 2021-11-13 DIAGNOSIS — Z79899 Other long term (current) drug therapy: Secondary | ICD-10-CM | POA: Diagnosis not present

## 2021-12-11 DIAGNOSIS — F3174 Bipolar disorder, in full remission, most recent episode manic: Secondary | ICD-10-CM | POA: Diagnosis not present

## 2022-02-20 DIAGNOSIS — Z79899 Other long term (current) drug therapy: Secondary | ICD-10-CM | POA: Diagnosis not present

## 2022-02-20 DIAGNOSIS — F902 Attention-deficit hyperactivity disorder, combined type: Secondary | ICD-10-CM | POA: Diagnosis not present

## 2022-02-25 DIAGNOSIS — F3174 Bipolar disorder, in full remission, most recent episode manic: Secondary | ICD-10-CM | POA: Diagnosis not present

## 2022-05-25 DIAGNOSIS — Z79891 Long term (current) use of opiate analgesic: Secondary | ICD-10-CM | POA: Diagnosis not present

## 2022-05-25 DIAGNOSIS — F902 Attention-deficit hyperactivity disorder, combined type: Secondary | ICD-10-CM | POA: Diagnosis not present

## 2022-05-25 DIAGNOSIS — Z5181 Encounter for therapeutic drug level monitoring: Secondary | ICD-10-CM | POA: Diagnosis not present

## 2022-05-25 DIAGNOSIS — Z79899 Other long term (current) drug therapy: Secondary | ICD-10-CM | POA: Diagnosis not present

## 2022-05-27 DIAGNOSIS — F3174 Bipolar disorder, in full remission, most recent episode manic: Secondary | ICD-10-CM | POA: Diagnosis not present

## 2022-06-04 IMAGING — CT CT CARDIAC CORONARY ARTERY CALCIUM SCORE
3 series · 14 of 20 positions shown, 16 images · non-contrast
Comparison: None.
COMPARISON: None.

Addendum:
EXAM:
OVER-READ INTERPRETATION  CT CHEST

The following report is an over-read performed by radiologist Dr.
Stillo Sharka [REDACTED] on 10/16/2020. This
over-read does not include interpretation of cardiac or coronary
anatomy or pathology. The coronary calcium score interpretation by
the cardiologist is attached.
CLINICAL DATA: Risk stratification
Coronary Calcium Score
TECHNIQUE: The patient was scanned on a Siemens Somatom 64 slice scanner. Axial
non-contrast 3 mm slices were carried out through the heart. The
data set was analyzed on a dedicated work station and scored using
the Agatson method.

[Series 2: casc 2.0 sa36 2 (id) (id) · axial · 0.43mm/px · z∈[-242,-146]mm · 6 of 68 slices shown, 8 images]
[im 10/68  vessel]
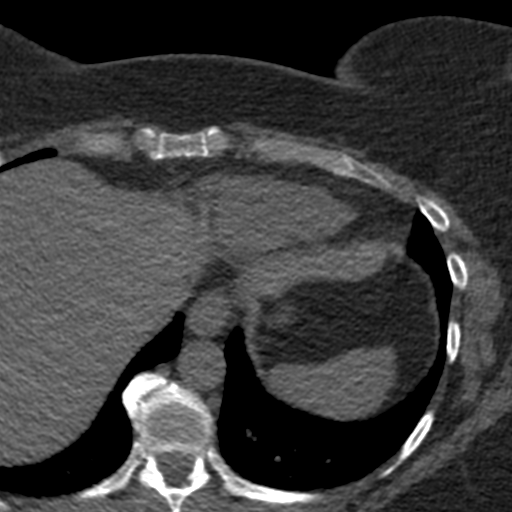
[im 10/68  lung]
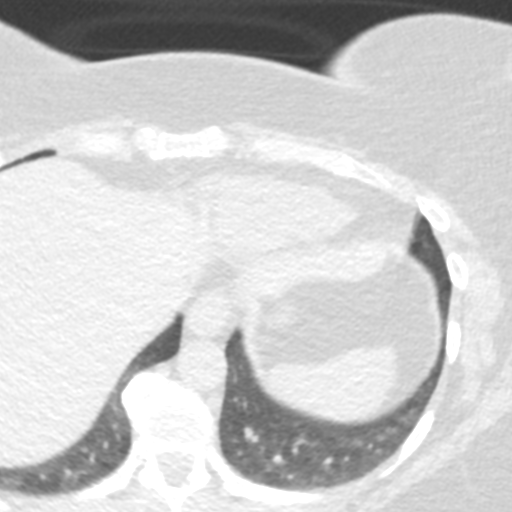
[im 20/68  vessel]
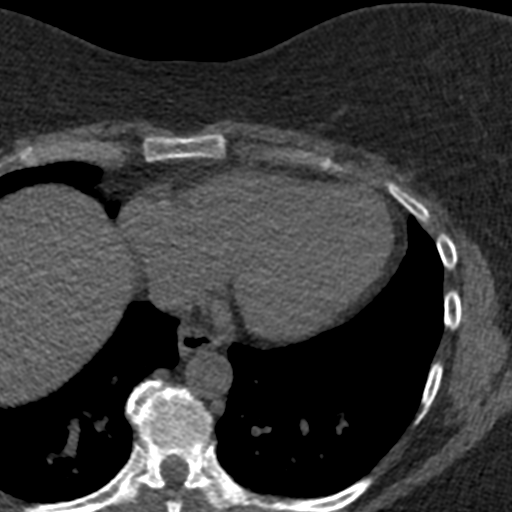
[im 29/68  vessel]
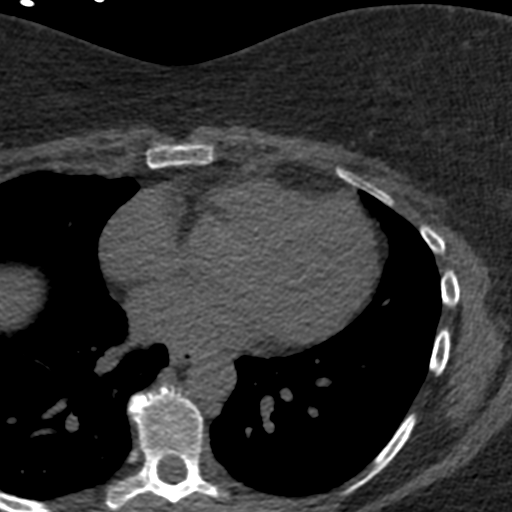
[im 39/68  vessel]
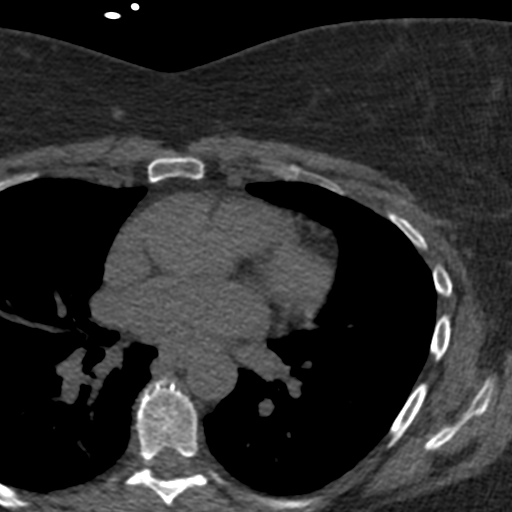
[im 48/68  vessel]
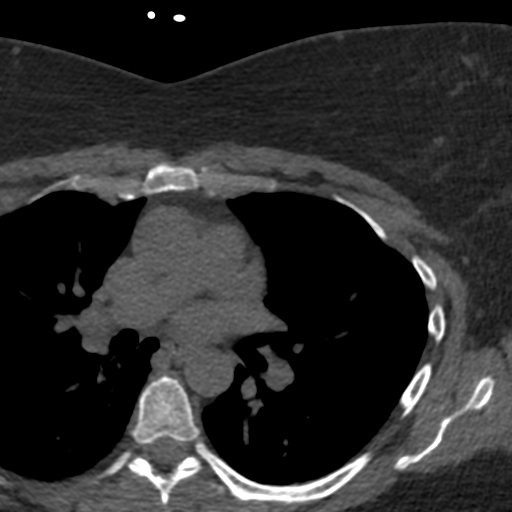
[im 48/68  lung]
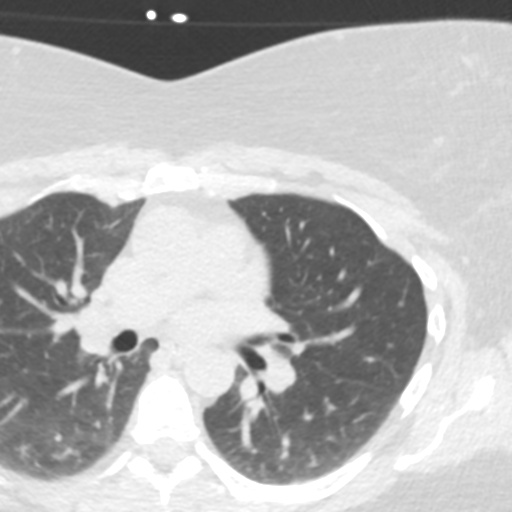
[im 58/68  vessel]
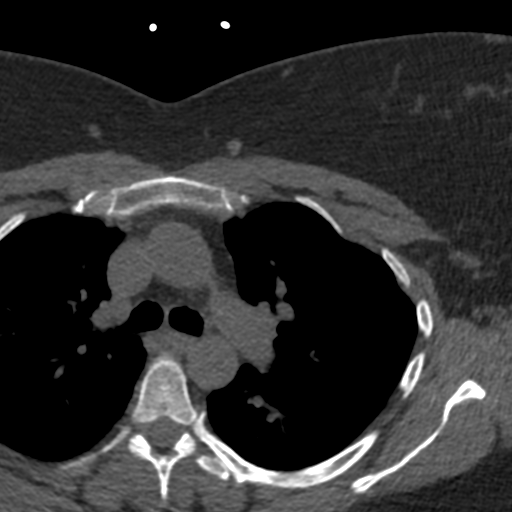

[Series 3: lung 70 % · axial · 0.68mm/px · z∈[-234,-152]mm · 4 of 46 slices shown]
[im 10/46  lung]
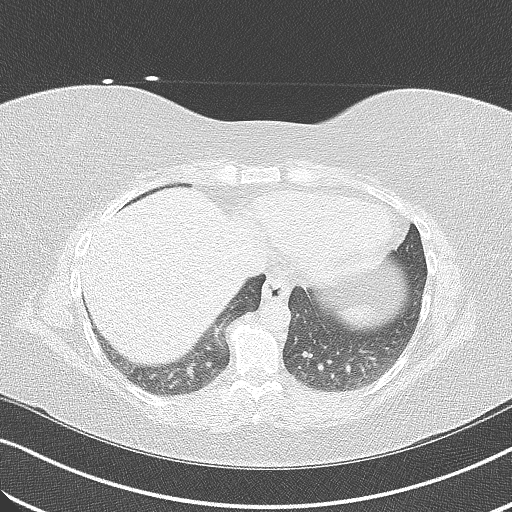
[im 19/46  lung]
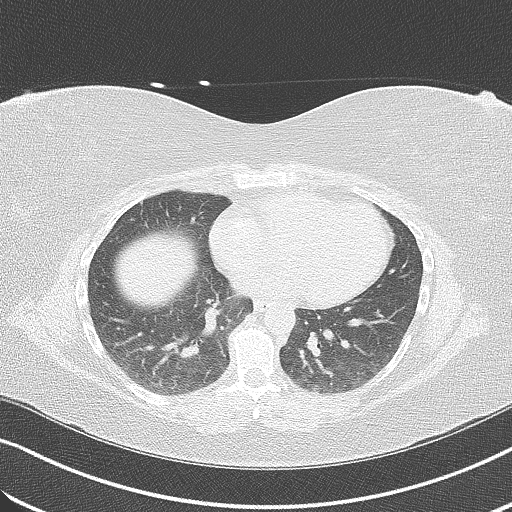
[im 28/46  lung]
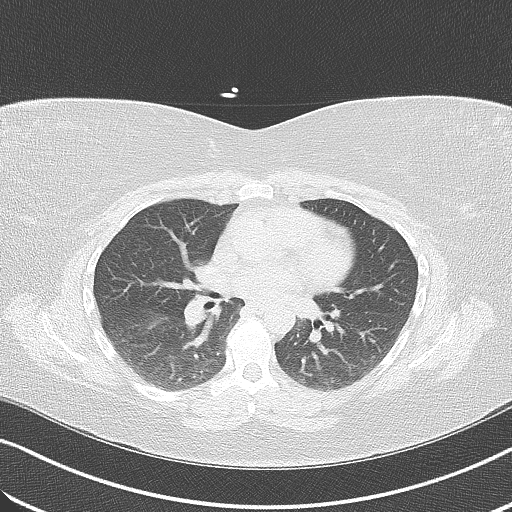
[im 37/46  lung]
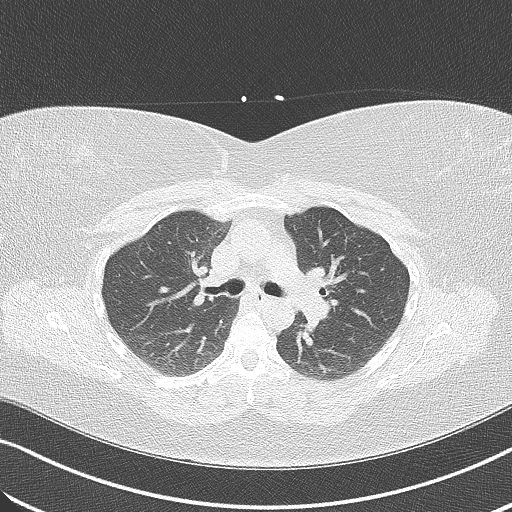

[Series 4: lung st 70 % · axial · 0.68mm/px · z∈[-234,-152]mm · 4 of 46 slices shown]
[im 10/46  lung]
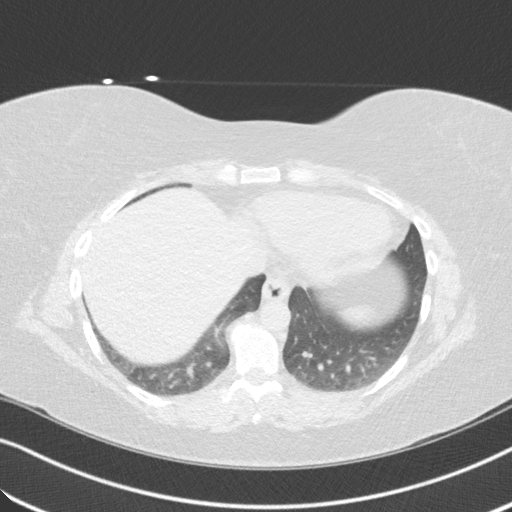
[im 19/46  lung]
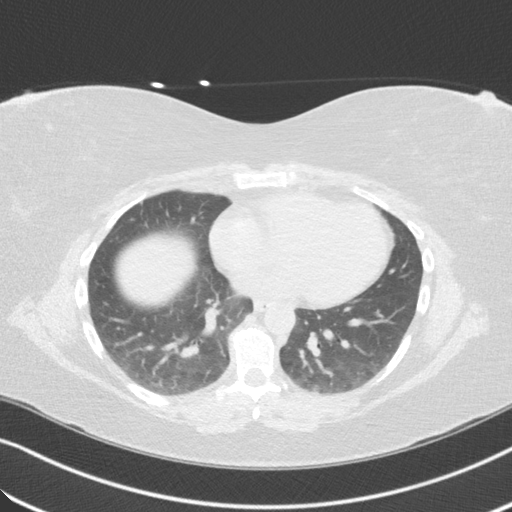
[im 28/46  lung]
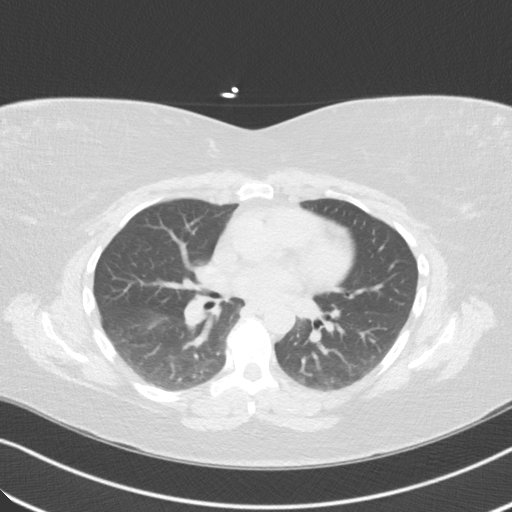
[im 37/46  lung]
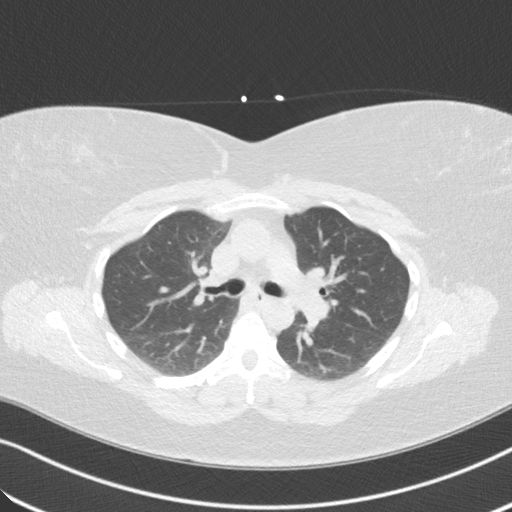

[14 of 20 positions shown; findings below may reference images not displayed]

FINDINGS: Vascular: No significant noncardiac vascular findings.

Mediastinum/Nodes: Visualized mediastinum and hilar regions
demonstrate no lymphadenopathy or masses.

Lungs/Pleura: Visualized lungs show no evidence of pulmonary edema,
consolidation, pneumothorax, nodule or pleural fluid.

Upper Abdomen: No acute abnormality.

Musculoskeletal: No chest wall mass or suspicious bone lesions
identified.
IMPRESSION: No significant incidental findings.
FINDINGS: Non-cardiac: See separate report from [REDACTED].

Ascending aorta: Normal diameter 2.9 cm

Pericardium: Normal

Coronary arteries: No calcium noted
IMPRESSION: Coronary calcium score of 0.

Kiesha Paulsen

*** End of Addendum ***
EXAM:
OVER-READ INTERPRETATION  CT CHEST

The following report is an over-read performed by radiologist Dr.
Stillo Sharka [REDACTED] on 10/16/2020. This
over-read does not include interpretation of cardiac or coronary
anatomy or pathology. The coronary calcium score interpretation by
the cardiologist is attached.
FINDINGS: Vascular: No significant noncardiac vascular findings.

Mediastinum/Nodes: Visualized mediastinum and hilar regions
demonstrate no lymphadenopathy or masses.

Lungs/Pleura: Visualized lungs show no evidence of pulmonary edema,
consolidation, pneumothorax, nodule or pleural fluid.

Upper Abdomen: No acute abnormality.

Musculoskeletal: No chest wall mass or suspicious bone lesions
identified.
IMPRESSION: No significant incidental findings.

## 2022-06-30 DIAGNOSIS — E785 Hyperlipidemia, unspecified: Secondary | ICD-10-CM | POA: Diagnosis not present

## 2022-06-30 DIAGNOSIS — Z79899 Other long term (current) drug therapy: Secondary | ICD-10-CM | POA: Diagnosis not present

## 2022-08-24 DIAGNOSIS — Z79899 Other long term (current) drug therapy: Secondary | ICD-10-CM | POA: Diagnosis not present

## 2022-08-24 DIAGNOSIS — F902 Attention-deficit hyperactivity disorder, combined type: Secondary | ICD-10-CM | POA: Diagnosis not present

## 2022-08-25 DIAGNOSIS — F902 Attention-deficit hyperactivity disorder, combined type: Secondary | ICD-10-CM | POA: Diagnosis not present

## 2022-08-26 DIAGNOSIS — F3174 Bipolar disorder, in full remission, most recent episode manic: Secondary | ICD-10-CM | POA: Diagnosis not present

## 2022-10-14 DIAGNOSIS — M778 Other enthesopathies, not elsewhere classified: Secondary | ICD-10-CM | POA: Diagnosis not present

## 2022-10-14 DIAGNOSIS — R059 Cough, unspecified: Secondary | ICD-10-CM | POA: Diagnosis not present

## 2022-10-14 DIAGNOSIS — E785 Hyperlipidemia, unspecified: Secondary | ICD-10-CM | POA: Diagnosis not present

## 2022-10-14 DIAGNOSIS — Z1331 Encounter for screening for depression: Secondary | ICD-10-CM | POA: Diagnosis not present

## 2022-10-14 DIAGNOSIS — Z0000 Encounter for general adult medical examination without abnormal findings: Secondary | ICD-10-CM | POA: Diagnosis not present

## 2022-11-01 DIAGNOSIS — Z1212 Encounter for screening for malignant neoplasm of rectum: Secondary | ICD-10-CM | POA: Diagnosis not present

## 2022-11-01 DIAGNOSIS — Z1211 Encounter for screening for malignant neoplasm of colon: Secondary | ICD-10-CM | POA: Diagnosis not present

## 2022-11-11 DIAGNOSIS — F3174 Bipolar disorder, in full remission, most recent episode manic: Secondary | ICD-10-CM | POA: Diagnosis not present

## 2022-11-24 DIAGNOSIS — Z79899 Other long term (current) drug therapy: Secondary | ICD-10-CM | POA: Diagnosis not present

## 2022-11-24 DIAGNOSIS — F902 Attention-deficit hyperactivity disorder, combined type: Secondary | ICD-10-CM | POA: Diagnosis not present

## 2022-12-16 DIAGNOSIS — D225 Melanocytic nevi of trunk: Secondary | ICD-10-CM | POA: Diagnosis not present

## 2022-12-16 DIAGNOSIS — M549 Dorsalgia, unspecified: Secondary | ICD-10-CM | POA: Diagnosis not present

## 2022-12-16 DIAGNOSIS — D2239 Melanocytic nevi of other parts of face: Secondary | ICD-10-CM | POA: Diagnosis not present

## 2022-12-16 DIAGNOSIS — L821 Other seborrheic keratosis: Secondary | ICD-10-CM | POA: Diagnosis not present

## 2022-12-16 DIAGNOSIS — E785 Hyperlipidemia, unspecified: Secondary | ICD-10-CM | POA: Diagnosis not present

## 2022-12-16 DIAGNOSIS — L578 Other skin changes due to chronic exposure to nonionizing radiation: Secondary | ICD-10-CM | POA: Diagnosis not present

## 2022-12-16 DIAGNOSIS — M8588 Other specified disorders of bone density and structure, other site: Secondary | ICD-10-CM | POA: Diagnosis not present

## 2022-12-16 DIAGNOSIS — J4520 Mild intermittent asthma, uncomplicated: Secondary | ICD-10-CM | POA: Diagnosis not present

## 2023-01-06 DIAGNOSIS — E2839 Other primary ovarian failure: Secondary | ICD-10-CM | POA: Diagnosis not present

## 2023-01-06 DIAGNOSIS — M8588 Other specified disorders of bone density and structure, other site: Secondary | ICD-10-CM | POA: Diagnosis not present

## 2023-01-06 DIAGNOSIS — M8589 Other specified disorders of bone density and structure, multiple sites: Secondary | ICD-10-CM | POA: Diagnosis not present

## 2023-02-10 DIAGNOSIS — F3174 Bipolar disorder, in full remission, most recent episode manic: Secondary | ICD-10-CM | POA: Diagnosis not present

## 2023-02-19 DIAGNOSIS — F902 Attention-deficit hyperactivity disorder, combined type: Secondary | ICD-10-CM | POA: Diagnosis not present

## 2023-02-19 DIAGNOSIS — Z79899 Other long term (current) drug therapy: Secondary | ICD-10-CM | POA: Diagnosis not present

## 2023-03-11 DIAGNOSIS — L910 Hypertrophic scar: Secondary | ICD-10-CM | POA: Diagnosis not present

## 2023-03-11 DIAGNOSIS — C44319 Basal cell carcinoma of skin of other parts of face: Secondary | ICD-10-CM | POA: Diagnosis not present

## 2023-03-15 DIAGNOSIS — H33101 Unspecified retinoschisis, right eye: Secondary | ICD-10-CM | POA: Diagnosis not present

## 2023-03-17 DIAGNOSIS — Z79899 Other long term (current) drug therapy: Secondary | ICD-10-CM | POA: Diagnosis not present

## 2023-03-17 DIAGNOSIS — H33321 Round hole, right eye: Secondary | ICD-10-CM | POA: Diagnosis not present

## 2023-03-17 DIAGNOSIS — H43811 Vitreous degeneration, right eye: Secondary | ICD-10-CM | POA: Diagnosis not present

## 2023-03-17 DIAGNOSIS — H43822 Vitreomacular adhesion, left eye: Secondary | ICD-10-CM | POA: Diagnosis not present

## 2023-03-17 DIAGNOSIS — F902 Attention-deficit hyperactivity disorder, combined type: Secondary | ICD-10-CM | POA: Diagnosis not present

## 2023-03-17 DIAGNOSIS — H2513 Age-related nuclear cataract, bilateral: Secondary | ICD-10-CM | POA: Diagnosis not present

## 2023-04-21 DIAGNOSIS — S63631A Sprain of interphalangeal joint of left index finger, initial encounter: Secondary | ICD-10-CM | POA: Diagnosis not present

## 2023-04-21 DIAGNOSIS — H43811 Vitreous degeneration, right eye: Secondary | ICD-10-CM | POA: Diagnosis not present

## 2023-04-21 DIAGNOSIS — H31091 Other chorioretinal scars, right eye: Secondary | ICD-10-CM | POA: Diagnosis not present

## 2023-04-21 DIAGNOSIS — Z6837 Body mass index (BMI) 37.0-37.9, adult: Secondary | ICD-10-CM | POA: Diagnosis not present

## 2023-04-21 DIAGNOSIS — H43822 Vitreomacular adhesion, left eye: Secondary | ICD-10-CM | POA: Diagnosis not present

## 2023-04-21 DIAGNOSIS — H2513 Age-related nuclear cataract, bilateral: Secondary | ICD-10-CM | POA: Diagnosis not present

## 2023-05-12 DIAGNOSIS — F3174 Bipolar disorder, in full remission, most recent episode manic: Secondary | ICD-10-CM | POA: Diagnosis not present

## 2023-05-19 DIAGNOSIS — H33101 Unspecified retinoschisis, right eye: Secondary | ICD-10-CM | POA: Diagnosis not present

## 2023-05-20 DIAGNOSIS — H2511 Age-related nuclear cataract, right eye: Secondary | ICD-10-CM | POA: Diagnosis not present

## 2023-05-20 DIAGNOSIS — H43391 Other vitreous opacities, right eye: Secondary | ICD-10-CM | POA: Diagnosis not present

## 2023-05-20 DIAGNOSIS — H43811 Vitreous degeneration, right eye: Secondary | ICD-10-CM | POA: Diagnosis not present

## 2023-05-20 DIAGNOSIS — F902 Attention-deficit hyperactivity disorder, combined type: Secondary | ICD-10-CM | POA: Diagnosis not present

## 2023-05-20 DIAGNOSIS — Z79899 Other long term (current) drug therapy: Secondary | ICD-10-CM | POA: Diagnosis not present

## 2023-05-20 DIAGNOSIS — H31091 Other chorioretinal scars, right eye: Secondary | ICD-10-CM | POA: Diagnosis not present

## 2023-06-16 DIAGNOSIS — Z6838 Body mass index (BMI) 38.0-38.9, adult: Secondary | ICD-10-CM | POA: Diagnosis not present

## 2023-06-16 DIAGNOSIS — J4520 Mild intermittent asthma, uncomplicated: Secondary | ICD-10-CM | POA: Diagnosis not present

## 2023-06-16 DIAGNOSIS — M549 Dorsalgia, unspecified: Secondary | ICD-10-CM | POA: Diagnosis not present

## 2023-06-16 DIAGNOSIS — E785 Hyperlipidemia, unspecified: Secondary | ICD-10-CM | POA: Diagnosis not present

## 2023-07-30 DIAGNOSIS — L209 Atopic dermatitis, unspecified: Secondary | ICD-10-CM | POA: Diagnosis not present

## 2023-08-04 DIAGNOSIS — F3174 Bipolar disorder, in full remission, most recent episode manic: Secondary | ICD-10-CM | POA: Diagnosis not present
